# Patient Record
Sex: Female | Born: 1995 | Hispanic: Yes | Marital: Single | Smoking: Former smoker
Health system: Southern US, Community
[De-identification: ages and names within clinical notes are randomized; demographics above are authoritative.]

## PROBLEM LIST (undated history)

## (undated) DIAGNOSIS — F909 Attention-deficit hyperactivity disorder, unspecified type: Secondary | ICD-10-CM

## (undated) HISTORY — PX: DENTAL SURGERY: SHX609

---

## 2014-12-03 ENCOUNTER — Ambulatory Visit (INDEPENDENT_AMBULATORY_CARE_PROVIDER_SITE_OTHER): Payer: Self-pay | Admitting: Internal Medicine

## 2014-12-03 VITALS — BP 100/60 | HR 66 | Temp 97.9°F | Resp 18 | Ht 61.0 in | Wt 120.0 lb

## 2014-12-03 DIAGNOSIS — N9089 Other specified noninflammatory disorders of vulva and perineum: Secondary | ICD-10-CM

## 2014-12-03 DIAGNOSIS — B356 Tinea cruris: Secondary | ICD-10-CM

## 2014-12-03 LAB — POCT WET PREP WITH KOH
KOH Prep POC: NEGATIVE
Trichomonas, UA: NEGATIVE
Yeast Wet Prep HPF POC: NEGATIVE

## 2014-12-03 MED ORDER — TERBINAFINE HCL 250 MG PO TABS: 250.0000 mg | ORAL_TABLET | Freq: Every day | ORAL | Status: DC

## 2014-12-03 NOTE — Progress Notes (Signed)
   Subjective:  This chart was scribed for [name], MD by [name], ED Scribe. The patient was seen in room 8. Patient's care was started at 9:22 AM.   Patient ID: Jacqueline [name], female    DOB: [birth year], 19 y.o.   MRN: [id number]  Chief Complaint  Patient presents with  . Rash    x2 mths near vaginal area speading to gluteal area   HPI HPI Comments: Jacqueline [name] is a 19 y.o. female, with no pertinent medical history, who presents to the [facility] complaining of gradually spreading itchy rash near her vaginal area to her gluteal area for the past 2 months. She states that rash began as a few bumps before becoming red and itchy. She also reports that her current rash is flaky with intermittent white discharge. Pt had a similar rash a few years ago that was less severe. Pt reports working out often, but has noticed that the rash spreads even when she stopped working out. She has also noticed that wearing pads during her menstrual cycle seems to aggravate the rash. Pt denies dysuria and dyspareunia, although she reports that she has not had sexual intercourse since onset of rash more than one year ago. She has been treating with Lotrimin cream with minimal relief.  No history of diabetes Works long hours in [employer] with lots of sweat History reviewed. No pertinent past medical history. No current outpatient prescriptions on file prior to visit.   No current facility-administered medications on file prior to visit.   No Known Allergies  Review of Systems  Genitourinary: Negative for dysuria and dyspareunia.  Skin: Positive for rash.   BP 100/60 mmHg  Pulse 66  Temp(Src) 97.9 F (36.6 C) (Oral)  Resp 18  Ht 5\' 1"  (1.549 m)  Wt 120 lb (54.432 kg)  BMI 22.69 kg/m2  SpO2 99%  LMP 12/01/2014    Objective:   Physical Exam  Constitutional: She is oriented to person, place, and time. She appears well-developed and well-nourished. No distress.   HENT:  Head: Normocephalic and atraumatic.  Eyes: Conjunctivae and EOM are normal.  Neck: Neck supple. No tracheal deviation present.  Cardiovascular: Normal rate.   Pulmonary/Chest: Effort normal. No respiratory distress.  Genitourinary:  Erythematous rash in the groins approaching the labia and the L buttock.   Musculoskeletal: Normal range of motion.  Neurological: She is alert and oriented to person, place, and time.  Skin: Skin is warm and dry. Rash noted.  Psychiatric: She has a normal mood and affect. Her behavior is normal.  Nursing note and vitals reviewed.    wet prep KOH shows no yeast Assessment & Plan:   I have completed the patient encounter in its entirety as documented by the scribe, with editing by me where necessary. [name], M.D.  Tinea cruris resistant to topicals Meds ordered this encounter  Medications  . terbinafine (LAMISIL) 250 MG tablet    Sig: Take 1 tablet (250 mg total) by mouth daily.    Dispense:  30 tablet    Refill:  0

## 2016-11-25 ENCOUNTER — Ambulatory Visit (INDEPENDENT_AMBULATORY_CARE_PROVIDER_SITE_OTHER): Payer: Self-pay | Admitting: Physician Assistant

## 2016-11-25 VITALS — BP 98/62 | HR 71 | Temp 98.9°F | Resp 16 | Ht 61.0 in | Wt 118.8 lb

## 2016-11-25 DIAGNOSIS — R8299 Other abnormal findings in urine: Secondary | ICD-10-CM

## 2016-11-25 DIAGNOSIS — R82998 Other abnormal findings in urine: Secondary | ICD-10-CM

## 2016-11-25 DIAGNOSIS — B3731 Acute candidiasis of vulva and vagina: Secondary | ICD-10-CM

## 2016-11-25 DIAGNOSIS — N898 Other specified noninflammatory disorders of vagina: Secondary | ICD-10-CM

## 2016-11-25 DIAGNOSIS — B373 Candidiasis of vulva and vagina: Secondary | ICD-10-CM

## 2016-11-25 LAB — POCT WET + KOH PREP: Trich by wet prep: ABSENT

## 2016-11-25 LAB — POC MICROSCOPIC URINALYSIS (UMFC): Mucus: ABSENT

## 2016-11-25 LAB — POCT URINE PREGNANCY: Preg Test, Ur: NEGATIVE

## 2016-11-25 LAB — POCT URINALYSIS DIP (MANUAL ENTRY)
Bilirubin, UA: NEGATIVE
Blood, UA: NEGATIVE
Glucose, UA: NEGATIVE mg/dL
Ketones, POC UA: NEGATIVE mg/dL
Nitrite, UA: NEGATIVE
Protein Ur, POC: NEGATIVE mg/dL
Spec Grav, UA: 1.025 (ref 1.010–1.025)
Urobilinogen, UA: 0.2 E.U./dL
pH, UA: 7 (ref 5.0–8.0)

## 2016-11-25 MED ORDER — FLUCONAZOLE 150 MG PO TABS: 150.0000 mg | ORAL_TABLET | Freq: Once | ORAL | 0 refills | Status: AC

## 2016-11-25 NOTE — Progress Notes (Signed)
sj

## 2016-11-25 NOTE — Progress Notes (Signed)
11/25/2016 at 1:09 PM  Jacqueline [name] / DOB: 1996/01/29 / MRN: [id number]  The patient  does not have a problem list on file.  SUBJECTIVE  Jacqueline [name] is a 21 y.o. female who complains of genital irritation and vaginal dryness x 4 days. Jacqueline [name] denies hematuria, urinary frequency, urinary urgency, flank pain, abdominal pain, genital rash and vaginal discharge. Jacqueline [name] uses vagisil wash daily for the past few months. Has not tried anything for relief. Jacqueline [name] is sexually active with monogamous partner. Last STD testing was a few months ago, all was normal. Jacqueline [name] has not changed partners since. LMP 10/29/16.   Jacqueline [name]  has no past medical history on file.    Medications reviewed and updated by myself where necessary, and exist elsewhere in the encounter.   Jacqueline [name] has No Known Allergies. Jacqueline [name]  reports that Jacqueline [name] has never smoked. Jacqueline [name] has never used smokeless tobacco. Jacqueline [name] reports that Jacqueline [name] does not drink alcohol or use drugs. Jacqueline [name]  has no sexual activity history on file. The patient  has no past surgical history on file.  Jacqueline [name] family history is not on file.  ROS  OBJECTIVE  Jacqueline [name]  height is 5\' 1"  (1.549 m) and weight is 118 lb 12.8 oz (53.9 kg). Jacqueline [name] oral temperature is 98.9 F (37.2 C). Jacqueline [name] blood pressure is 98/62 and Jacqueline [name] pulse is 71. Jacqueline [name] respiration is 16 and oxygen saturation is 100%.  The patient's body mass index is 22.45 kg/m.  Physical Exam  Constitutional: Jacqueline [name] is oriented to person, place, and time. Jacqueline [name] appears well-developed and well-nourished. No distress.  HENT:  Head: Normocephalic and atraumatic.  Eyes: Conjunctivae are normal.  Respiratory: Effort normal.  Genitourinary: There is erythema (of vulva) and tenderness (with speculum exam ) in the vagina. No bleeding in the vagina. Vaginal discharge (white, curdy discharge in vaginal canal) found.  Musculoskeletal: Normal range of motion.  Neurological: Jacqueline [name] is alert and oriented to person, place, and time.  Skin: Skin is warm and dry.    Psychiatric: Jacqueline [name] has a normal mood and affect.    Results for orders placed or performed in visit on 11/25/16 (from the past 24 hour(s))  POCT Wet + KOH Prep     Status: Abnormal   Collection Time: 11/25/16 12:54 PM  Result Value Ref Range   Yeast by KOH Present (A) Absent   Yeast by wet prep Present (A) Absent   WBC by wet prep Moderate (A) Few   Clue Cells Wet Prep HPF POC Few (A) None   Trich by wet prep Absent Absent   Bacteria Wet Prep HPF POC Many (A) Few   Epithelial Cells By [employer] Pref ([facility]) Moderate (A) None, Few, Too numerous to count   RBC,UR,HPF,POC None None RBC/hpf  POCT urinalysis dipstick     Status: Abnormal   Collection Time: 11/25/16 12:55 PM  Result Value Ref Range   Color, UA yellow yellow   Clarity, UA cloudy (A) clear   Glucose, UA negative negative mg/dL   Bilirubin, UA negative negative   Ketones, POC UA negative negative mg/dL   Spec Grav, UA 1.025 1.010 - 1.025   Blood, UA negative negative   pH, UA 7.0 5.0 - 8.0   Protein Ur, POC negative negative mg/dL   Urobilinogen, UA 0.2 0.2 or 1.0 E.U./dL   Nitrite, UA Negative Negative   Leukocytes, UA small (1+) small (1+)  POCT urine pregnancy     Status: None   Collection Time: 11/25/16 12:56 PM  Result Value Ref Range   Preg Test, Ur Negative Negative  POCT Microscopic Urinalysis ([facility])     Status: Abnormal   Collection Time: 11/25/16 12:58 PM  Result Value Ref Range   WBC,UR,HPF,POC Moderate (A) None WBC/hpf   RBC,UR,HPF,POC None None RBC/hpf   Bacteria Many (A) None, Too numerous to count   Mucus Absent Absent   Epithelial Cells, UR Per Microscopy Moderate (A) None, Too numerous to count cells/hpf    ASSESSMENT & PLAN  Jacqueline [name] was seen today for vaginal pain.  Diagnoses and all orders for this visit:  Vaginal dryness -     POCT urinalysis dipstick -     POCT Microscopic Urinalysis ([facility]) -     POCT Wet + KOH Prep -     POCT urine pregnancy  Leukocytes in urine - Jacqueline [name] is not experiencing  any urinary symptoms at this time, will hold off on Rx for antibiotic. Urine culture pending. Jacqueline [name] instructed to contact our office if Jacqueline [name] develops any urinary symptoms or lower abdominal pain.  -     Urine culture  Vaginal yeast infection -     fluconazole (DIFLUCAN) 150 MG tablet; Take 1 tablet (150 mg total) by mouth once.    The patient was advised to call or come back to clinic if Jacqueline [name] does not see an improvement in symptoms, or worsens with the above plan.   Tenna Delaine, PA-C [facility] 11/25/2016 1:09 PM

## 2016-11-25 NOTE — Patient Instructions (Addendum)
You should stop using the vagisil wash daily as this can actually lead to worsening symptoms and recurrent infections. Plan soap and water is sufficient for cleansing the vagina. It does appear from your lab results that you are having a yeast infection. I have prescribed an oral tablet for this condition. Please let me know if your symptoms persist after 48 hours of taking the tablet.It also appears that you may be having a UTI, I have sent off a urine culture and will contact you with the results if you need an antibiotic. In the meantime, if you develop burning with urination, urinary frequency, or lower abdominal discomfort, please call our office.   Thank you for letting me participate in your health and well being.       Vaginal Yeast infection, Adult Vaginal yeast infection is a condition that causes soreness, swelling, and redness (inflammation) of the vagina. It also causes vaginal discharge. This is a common condition. Some women get this infection frequently. What are the causes? This condition is caused by a change in the normal balance of the yeast (candida) and bacteria that live in the vagina. This change causes an overgrowth of yeast, which causes the inflammation. What increases the risk? This condition is more likely to develop in:  Women who take antibiotic medicines.  Women who have diabetes.  Women who take birth control pills.  Women who are pregnant.  Women who douche often.  Women who have a weak defense (immune) system.  Women who have been taking steroid medicines for a long time.  Women who frequently wear tight clothing. What are the signs or symptoms? Symptoms of this condition include:  White, thick vaginal discharge.  Swelling, itching, redness, and irritation of the vagina. The lips of the vagina (vulva) may be affected as well.  Pain or a burning feeling while urinating.  Pain during sex. How is this diagnosed? This condition is diagnosed with  a medical history and physical exam. This will include a pelvic exam. Your health care provider will examine a sample of your vaginal discharge under a microscope. Your health care provider may send this sample for testing to confirm the diagnosis. How is this treated? This condition is treated with medicine. Medicines may be over-the-counter or prescription. You may be told to use one or more of the following:  Medicine that is taken orally.  Medicine that is applied as a cream.  Medicine that is inserted directly into the vagina (suppository). Follow these instructions at home:  Take or apply over-the-counter and prescription medicines only as told by your health care provider.  Do not have sex until your health care provider has approved. Tell your sex partner that you have a yeast infection. That person should go to his or her health care provider if he or she develops symptoms.  Do not wear tight clothes, such as pantyhose or tight pants.  Avoid using tampons until your health care provider approves.  Eat more yogurt. This may help to keep your yeast infection from returning.  Try taking a sitz bath to help with discomfort. This is a warm water bath that is taken while you are sitting down. The water should only come up to your hips and should cover your buttocks. Do this 3-4 times per day or as told by your health care provider.  Do not douche.  Wear breathable, cotton underwear.  If you have diabetes, keep your blood sugar levels under control. Contact a health care provider  if:  You have a fever.  Your symptoms go away and then return.  Your symptoms do not get better with treatment.  Your symptoms get worse.  You have new symptoms.  You develop blisters in or around your vagina.  You have blood coming from your vagina and it is not your menstrual period.  You develop pain in your abdomen. This information is not intended to replace advice given to you by your  health care provider. Make sure you discuss any questions you have with your health care provider. Document Released: 05/13/2005 Document Revised: 01/15/2016 Document Reviewed: 02/04/2015 Elsevier Interactive Patient Education  2017 [employer].     IF you received an x-ray today, you will receive an invoice from [facility]. Please contact [facility] at [phone] with questions or concerns regarding your invoice.   IF you received labwork today, you will receive an invoice from [place]. Please contact [facility] at [phone] with questions or concerns regarding your invoice.   Our billing staff will not be able to assist you with questions regarding bills from these companies.  You will be contacted with the lab results as soon as they are available. The fastest way to get your results is to activate your My Chart account. Instructions are located on the last page of this paperwork. If you have not heard from [place] regarding the results in 2 weeks, please contact this office.

## 2016-11-26 ENCOUNTER — Telehealth: Payer: Self-pay | Admitting: Family Medicine

## 2016-11-26 LAB — URINE CULTURE

## 2016-11-26 NOTE — Telephone Encounter (Signed)
[place] patient is calling back saying that she is having dysuria and irritation feels like its inflamed please respond

## 2016-11-26 NOTE — Telephone Encounter (Signed)
Please see urine results

## 2016-11-28 ENCOUNTER — Encounter (HOSPITAL_COMMUNITY): Payer: Self-pay | Admitting: *Deleted

## 2016-11-28 ENCOUNTER — Emergency Department (HOSPITAL_COMMUNITY)
Admission: EM | Admit: 2016-11-28 | Discharge: 2016-11-28 | Disposition: A | Discharge status: Home / Self Care | Payer: Self-pay | Attending: Emergency Medicine | Admitting: Emergency Medicine

## 2016-11-28 DIAGNOSIS — B3731 Acute candidiasis of vulva and vagina: Secondary | ICD-10-CM

## 2016-11-28 DIAGNOSIS — N72 Inflammatory disease of cervix uteri: Secondary | ICD-10-CM | POA: Insufficient documentation

## 2016-11-28 DIAGNOSIS — B373 Candidiasis of vulva and vagina: Secondary | ICD-10-CM | POA: Insufficient documentation

## 2016-11-28 LAB — POC URINE PREG, ED: Preg Test, Ur: NEGATIVE

## 2016-11-28 MED ORDER — CEFTRIAXONE SODIUM 250 MG IJ SOLR
250.0000 mg | Freq: Once | INTRAMUSCULAR | Status: AC
  Administered 2016-11-28: 250 mg via INTRAMUSCULAR
  Filled 2016-11-28: qty 250

## 2016-11-28 MED ORDER — MICONAZOLE NITRATE 200 & 2 MG-% (9GM) VA KIT: 1.0000 | PACK | Freq: Every evening | VAGINAL | 0 refills | Status: DC

## 2016-11-28 MED ORDER — STERILE WATER FOR INJECTION IJ SOLN
INTRAMUSCULAR | Status: AC
  Administered 2016-11-28: 10 mL via INTRAMUSCULAR
  Filled 2016-11-28: qty 10

## 2016-11-28 MED ORDER — STERILE WATER FOR INJECTION IJ SOLN
10.0000 mL | Freq: Once | INTRAMUSCULAR | Status: AC
  Administered 2016-11-28: 10 mL via INTRAMUSCULAR

## 2016-11-28 MED ORDER — DOXYCYCLINE HYCLATE 100 MG PO CAPS: 100.0000 mg | ORAL_CAPSULE | Freq: Two times a day (BID) | ORAL | 0 refills | Status: DC

## 2016-11-28 NOTE — ED Notes (Addendum)
Patient feels she has a yeast infection which began Wednesday. 6/10 pain in vaginal area.  Reports she was seen Wednesday at [facility]. Prescribed Diflucan x 1. States she felt worse after taking medication. No improvement in symptoms.

## 2016-11-28 NOTE — ED Triage Notes (Signed)
Pt c/o vaginal burning and itching since last Tuesday, worsening last night with vag discharge. Denies abd pain

## 2016-11-28 NOTE — ED Provider Notes (Signed)
[place] DEPT Provider Note   CSN: [id number] Arrival date & time: 11/28/16  3888     History   Chief Complaint Chief Complaint  Patient presents with  . Vaginal Itching    HPI Jacqueline [name] is a 21 y.o. female.  21yo F who p/w vaginal itching and pain. Pt has had 6 days of vaginal itching and burning. She was seen in the clinic a few days ago and diagnosed w/ yeast infection, given diflucan which she took. She reports that her symptoms have been worsening since she took the medication and she has seen no improvement whatsoever. She reports some mild suprapubic abdominal pain and worsening burning and pain in her vagina. She has not been sexually active since the symptoms began. She reports pain of the skin when urinating but no urethral burning. No fevers, vomiting, or other new symptoms. She is sexually active with one partner. She does use an over-the-counter feminine soap and wipes.   The history is provided by the patient.    History reviewed. No pertinent past medical history.  There are no active problems to display for this patient.   History reviewed. No pertinent surgical history.  OB History    No data available       Home Medications    Prior to Admission medications   Medication Sig Start Date End Date Taking? Authorizing Provider  doxycycline (VIBRAMYCIN) 100 MG capsule Take 1 capsule (100 mg total) by mouth 2 (two) times daily. 11/28/16   Sharlett Iles, MD  miconazole nitrate (MICONAZOLE 3 COMBO PACK) 200 & 2 MG-% (9GM) KIT Place 1 applicator vaginally Nightly. Insert applicator nightly for 7 days as directed on box. Apply cream on external vagina/vulva once daily for 7 days or until symptoms resolve 11/28/16   Sharlett Iles, MD    Family History No family history on file.  Social History Social History  Substance Use Topics  . Smoking status: Never Smoker  . Smokeless tobacco: Never Used  . Alcohol use 0.0 oz/week      Allergies   Patient has no known allergies.   Review of Systems Review of Systems  All other systems reviewed and are negative.    Physical Exam Updated Vital Signs BP 98/62   Pulse 67   Temp 99.4 F (37.4 C) (Oral)   Resp 20   LMP 10/29/2016   SpO2 99%   Physical Exam  Constitutional: She is oriented to person, place, and time. She appears well-developed and well-nourished. No distress.  HENT:  Head: Normocephalic and atraumatic.  Moist mucous membranes  Eyes: Conjunctivae are normal. Pupils are equal, round, and reactive to light.  Neck: Neck supple.  Cardiovascular: Normal rate, regular rhythm and normal heart sounds.   No murmur heard. Pulmonary/Chest: Effort normal and breath sounds normal.  Abdominal: Soft. Bowel sounds are normal. She exhibits no distension and no mass. There is tenderness (mild suprapubic). There is no rebound and no guarding.  Genitourinary:  Genitourinary Comments: Mild erythema of vulva; erythema and tenderness of vaginal with thick white curd-like vaginal discharge; suprapubic tenderness on bimanual exam, no adnexal tenderness  Musculoskeletal: She exhibits no edema.  Neurological: She is alert and oriented to person, place, and time.  Fluent speech  Skin: Skin is warm and dry.  Psychiatric: She has a normal mood and affect. Judgment normal.  Nursing note and vitals reviewed.  Chaperone was present during exam.    ED Treatments / Results  Labs (all labs ordered are  listed, but only abnormal results are displayed) Labs Reviewed  POC URINE PREG, ED  GC/CHLAMYDIA PROBE AMP ([place]) NOT AT [facility]    EKG  EKG Interpretation None       Radiology No results found.  Procedures Procedures (including critical care time)  Medications Ordered in ED Medications  cefTRIAXone (ROCEPHIN) injection 250 mg (250 mg Intramuscular Given 11/28/16 0835)  sterile water (preservative free) injection 10 mL (10 mLs Injection Given  11/28/16 0835)     Initial Impression / Assessment and Plan / ED Course  I have reviewed the triage vital signs and the nursing notes.  Pertinent labs  that were available during my care of the patient were reviewed by me and considered in my medical decision making (see chart for details).    PT w/ ongoing Vaginal burning and itching that did not improve after Diflucan treatment a few days ago. She was well-appearing on exam with normal vital signs. She had mild suprapubic abdominal tenderness with no rebound or guarding. Pelvic exam showed findings consistent with a yeast infection but I am concerned about the presence of suprapubic tenderness and therefore will treat with ceftriaxone and doxycycline to cover PID. Also recommended 7 day Monistat treatment with both applicator and topical treatment of yeast. I reviewed her clinic note from a few days ago which shows negative UA and wet prep notable only for yeast therefore did not repeat wet prep today as her symptoms appear exactly consistent with clinic note. Extensively reviewed return precautions and provided with women's clinic follow-up information. Patient voiced understanding and was discharged in satisfactory condition.  Final Clinical Impressions(s) / ED Diagnoses   Final diagnoses:  Yeast vaginitis  Cervicitis    New Prescriptions Discharge Medication List as of 11/28/2016  8:31 AM    START taking these medications   Details  doxycycline (VIBRAMYCIN) 100 MG capsule Take 1 capsule (100 mg total) by mouth 2 (two) times daily., Starting Sat 11/28/2016, Print    miconazole nitrate (MICONAZOLE 3 COMBO PACK) 200 & 2 MG-% (9GM) KIT Place 1 applicator vaginally Nightly. Insert applicator nightly for 7 days as directed on box. Apply cream on external vagina/vulva once daily for 7 days or until symptoms resolve, Starting Sat 11/28/2016, Print         Sharlett Iles, MD 11/28/16 [phone]

## 2016-11-28 NOTE — Discharge Instructions (Signed)
FOLLOW INSTRUCTIONS ON MONISTAT 7-DAY KIT TO APPLY MEDICATION FOR YEAST INFECTION. TAKE ANTIBIOTICS AS PRESCRIBED UNTIL THEY ARE FINISHED. STOP USING ANY SOAPS OR WIPES ON VAGINAL AREA AND USE WATER ONLY TO [place]. RETURN IMMEDIATELY IF YOU HAVE ANY WORSENING SYMPTOMS, FEVER, VOMITING, OR SEVERE ABDOMINAL PAIN.

## 2016-11-30 ENCOUNTER — Ambulatory Visit: Payer: Self-pay

## 2016-11-30 LAB — GC/CHLAMYDIA PROBE AMP (~~LOC~~) NOT AT ARMC
Chlamydia: NEGATIVE
Neisseria Gonorrhea: NEGATIVE

## 2016-11-30 NOTE — Telephone Encounter (Signed)
Pt contacted. She apparently was having worsening vaginal itching and went to the ED. They treated her for possible PID and added a topical miconazole.

## 2017-07-23 ENCOUNTER — Other Ambulatory Visit (HOSPITAL_COMMUNITY): Payer: Self-pay | Admitting: *Deleted

## 2017-07-23 DIAGNOSIS — N6310 Unspecified lump in the right breast, unspecified quadrant: Secondary | ICD-10-CM

## 2017-07-29 ENCOUNTER — Other Ambulatory Visit (HOSPITAL_COMMUNITY): Payer: Self-pay | Admitting: Obstetrics and Gynecology

## 2017-07-29 ENCOUNTER — Ambulatory Visit
Admission: RE | Admit: 2017-07-29 | Discharge: 2017-07-29 | Disposition: A | Discharge status: Home / Self Care | Payer: Self-pay | Source: Ambulatory Visit | Attending: Obstetrics and Gynecology | Admitting: Obstetrics and Gynecology

## 2017-07-29 ENCOUNTER — Ambulatory Visit (HOSPITAL_COMMUNITY)
Admission: RE | Admit: 2017-07-29 | Discharge: 2017-07-29 | Disposition: A | Discharge status: Home / Self Care | Payer: Self-pay | Source: Ambulatory Visit | Attending: Obstetrics and Gynecology | Admitting: Obstetrics and Gynecology

## 2017-07-29 ENCOUNTER — Encounter (HOSPITAL_COMMUNITY): Payer: Self-pay

## 2017-07-29 VITALS — BP 120/70 | Temp 97.6°F | Ht 61.0 in | Wt 113.2 lb

## 2017-07-29 DIAGNOSIS — Z01419 Encounter for gynecological examination (general) (routine) without abnormal findings: Secondary | ICD-10-CM

## 2017-07-29 DIAGNOSIS — N6310 Unspecified lump in the right breast, unspecified quadrant: Secondary | ICD-10-CM

## 2017-07-29 NOTE — Addendum Note (Signed)
Encounter addended by: Stephanie Coup, LPN on: 16/55/3748 27:07 PM  Actions taken: Order list changed

## 2017-07-29 NOTE — Progress Notes (Signed)
Complaints of mobile right breast lump x 2 months that is painful at times. Patient states the pain has decreased over the past week. Patient stated she initially rated it a 7 out of 10 but now rates it a 4 out of 10.  Pap Smear: Pap smear completed today. Per patient has never had a Pap smear. No Pap smear results are in Epic.  Physical exam: Breasts Breasts symmetrical. No skin abnormalities bilateral breasts. No nipple retraction bilateral breasts. No nipple discharge bilateral breasts. No lymphadenopathy. No lumps palpated left breast. Palpated a mobile lump within the right breast between 1-2 o'clock next to areola. No complaints of pain or tenderness on exam. Referred patient to the [facility] for a right breast ultrasound. Appointment scheduled for Thursday, July 29, 2017 at 1100.  Pelvic/Bimanual   Ext Genitalia No lesions, no swelling and no discharge observed on external genitalia.         Vagina Vagina pink and normal texture. No lesions or discharge observed in vagina.          Cervix Cervix is present. Cervix pink and of normal texture. Cervix friable. No discharge observed.     Uterus Uterus is present and palpable. Uterus in normal position and normal size.        Adnexae Bilateral ovaries present and palpable. No tenderness on palpation.          Rectovaginal No rectal exam completed today since patient had no rectal complaints. No skin abnormalities observed on exam.    Smoking History: Patient has never smoked.  Patient Navigation: Patient education provided. Access to services provided for patient through [facility] program.

## 2017-07-29 NOTE — Patient Instructions (Signed)
Explained breast self awareness with Jacqueline [name]. Let patient know [facility] will cover Pap smears every 3 years unless has a history of abnormal Pap smears. Referred patient to the [facility] for a right breast ultrasound. Appointment scheduled for Thursday, July 29, 2017 at 1100. Let patient know will follow up with her within the next couple weeks with results of Pap smear by letter or phone. Jacqueline [name] verbalized understanding.  Jacqueline [name], [name], RN 10:00 AM

## 2017-07-30 ENCOUNTER — Encounter (HOSPITAL_COMMUNITY): Payer: Self-pay

## 2017-08-03 LAB — CYTOLOGY - PAP
Bacterial vaginitis: POSITIVE — AB
Candida vaginitis: NEGATIVE
Diagnosis: UNDETERMINED — AB
HPV: NOT DETECTED
Trichomonas: NEGATIVE

## 2017-08-13 ENCOUNTER — Other Ambulatory Visit (HOSPITAL_COMMUNITY): Payer: Self-pay | Admitting: *Deleted

## 2017-08-13 ENCOUNTER — Telehealth (HOSPITAL_COMMUNITY): Payer: Self-pay | Admitting: *Deleted

## 2017-08-13 MED ORDER — METRONIDAZOLE 500 MG PO TABS: 500.0000 mg | ORAL_TABLET | Freq: Two times a day (BID) | ORAL | 0 refills | Status: DC

## 2017-08-13 NOTE — Telephone Encounter (Signed)
Telephoned patient at home number and advised patient of abnormal pap smear results. HPV was negative. Next pap smear due in one year. Advised patient pap smear did show bacterial vaginosis and medication was called into Product/process development scientist at [facility]. Advised patient to finish all medication and no alcohol while taking. Patient voiced understanding. Used interpreter Benjamine Sprague.

## 2017-12-02 ENCOUNTER — Ambulatory Visit: Payer: Self-pay | Admitting: Physician Assistant

## 2017-12-02 ENCOUNTER — Other Ambulatory Visit: Payer: Self-pay

## 2017-12-02 ENCOUNTER — Encounter: Payer: Self-pay | Admitting: Physician Assistant

## 2017-12-02 VITALS — BP 100/62 | HR 88 | Temp 98.6°F | Resp 17 | Ht 61.0 in | Wt 121.8 lb

## 2017-12-02 DIAGNOSIS — H60502 Unspecified acute noninfective otitis externa, left ear: Secondary | ICD-10-CM

## 2017-12-02 DIAGNOSIS — R61 Generalized hyperhidrosis: Secondary | ICD-10-CM

## 2017-12-02 MED ORDER — HYDROCORTISONE-ACETIC ACID 1-2 % OT SOLN: 5.0000 [drp] | OTIC | 0 refills | Status: DC

## 2017-12-02 NOTE — Progress Notes (Signed)
     MRN: [id number] DOB: 1996-05-09  Subjective:   Jacqueline [name] is a 22 y.o. female presenting for chief complaint of Ear Pain (x yesterday, left ear pain, pain level 7/10.  Per pt used 1/2 water and 1/2 vinegar solution in ear for a minute but no relief) .  Reports 2 day history of left ear pain. Has some itching, drainage, and decreased hearing. Does have some nasal congestion. Denies fever, tinnitus, sinus pain, itchy watery eyes and sore throat, nausea, vomiting, abdominal pain and diarrhea. Has tried vinegar and water with no relief. No recent swimming. Denies use of qtips. Has history of seasonal allergies, no history of asthma.  Excessive sweating x  5th grade. Happens on bilateral palms, arms, face, upper lip, forehead, and feet. Happens every day. Can happen at any time: sitting, walking, standing.  No other symptoms when this happens. Has tried antiperspirants and many home remedies no full resolution. Has had thyroid studies recently and they were normal. Avoids working out due to this. FH of hyperhidrosis in siblings.    Jacqueline [name] currently has no medications in their medication list. Also has No Known Allergies.  Jacqueline [name]  has no past medical history on file. Also  has no past surgical history on file.   Objective:   Vitals: BP 100/62 (BP Location: Left Arm, Patient Position: Sitting, Cuff Size: Normal)   Pulse 88   Temp 98.6 F (37 C) (Oral)   Resp 17   Ht 5\' 1"  (1.549 m)   Wt 121 lb 12.8 oz (55.2 kg)   LMP 11/12/2017   SpO2 96%   BMI 23.01 kg/m   Physical Exam  Constitutional: She is oriented to person, place, and time. She appears well-developed and well-nourished. No distress.  HENT:  Head: Normocephalic and atraumatic.  Right Ear: Tympanic membrane, external ear and ear canal normal.  Left Ear: Tympanic membrane normal. There is swelling (mild swelling of ear canal) and tenderness (with palpation of tragus). No drainage. Tympanic membrane is not perforated, not  erythematous and not bulging.  Nose: Mucosal edema (bilateral) present.  Mouth/Throat: Uvula is midline, oropharynx is clear and moist and mucous membranes are normal.  Eyes: Conjunctivae are normal.  Neck: Normal range of motion.  Pulmonary/Chest: Effort normal.  Neurological: She is alert and oriented to person, place, and time.  Skin: Skin is warm.  Bilateral palmar hyperhidrosis noted.   Psychiatric: She has a normal mood and affect.  Vitals reviewed.   No results found for this or any previous visit (from the past 24 hour(s)).  Assessment and Plan :  1. Acute otitis externa of left ear, unspecified type Mild in nature. Recommend tx with vosol-hc. Follow up as needed. - acetic acid-hydrocortisone (VOSOL-HC) OTIC solution; Place 5 drops into the left ear every 4 (four) hours.  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

## 2017-12-02 NOTE — Patient Instructions (Signed)
Your ear exam is consistent with otitis externa.  I have sent in a prescription for eardrops to use.  I also recommend continuing with allergy medicine daily for the next 2 weeks.  In terms of sweating, place referral to dermatology. You should hear from them within 1-2 weeks to schedule appointment.   Otitis Externa Otitis externa is an infection of the outer ear canal. The outer ear canal is the area between the outside of the ear and the eardrum. Otitis externa is sometimes called "swimmer's ear." Follow these instructions at home:  If you were given antibiotic ear drops, use them as told by your doctor. Do not stop using them even if your condition gets better.  Take over-the-counter and prescription medicines only as told by your doctor.  Keep all follow-up visits as told by your doctor. This is important. How is this prevented?  Keep your ear dry. Use the corner of a towel to dry your ear after you swim or bathe.  Try not to scratch or put things in your ear. Doing these things makes it easier for germs to grow in your ear.  Avoid swimming in lakes, dirty water, or pools that may not have the right amount of a chemical called chlorine.  Consider making ear drops and putting 3 or 4 drops in each ear after you swim. Ask your doctor about how you can make ear drops. Contact a doctor if:  You have a fever.  After 3 days your ear is still red, swollen, or painful.  After 3 days you still have pus coming from your ear.  Your redness, swelling, or pain gets worse.  You have a really bad headache.  You have redness, swelling, pain, or tenderness behind your ear. This information is not intended to replace advice given to you by your health care provider. Make sure you discuss any questions you have with your health care provider. Document Released: 01/20/2008 Document Revised: 08/29/2015 Document Reviewed: 05/13/2015 Elsevier Interactive Patient Education  [employer].

## 2018-01-27 ENCOUNTER — Other Ambulatory Visit: Payer: Self-pay

## 2018-01-28 ENCOUNTER — Other Ambulatory Visit (HOSPITAL_COMMUNITY): Payer: Self-pay | Admitting: Obstetrics and Gynecology

## 2018-01-28 ENCOUNTER — Ambulatory Visit
Admission: RE | Admit: 2018-01-28 | Discharge: 2018-01-28 | Disposition: A | Discharge status: Home / Self Care | Payer: No Typology Code available for payment source | Source: Ambulatory Visit | Attending: Obstetrics and Gynecology | Admitting: Obstetrics and Gynecology

## 2018-01-28 DIAGNOSIS — N6310 Unspecified lump in the right breast, unspecified quadrant: Secondary | ICD-10-CM

## 2018-02-02 ENCOUNTER — Other Ambulatory Visit (HOSPITAL_COMMUNITY): Payer: Self-pay | Admitting: Obstetrics and Gynecology

## 2018-02-02 ENCOUNTER — Ambulatory Visit
Admission: RE | Admit: 2018-02-02 | Discharge: 2018-02-02 | Disposition: A | Discharge status: Home / Self Care | Payer: No Typology Code available for payment source | Source: Ambulatory Visit | Attending: Obstetrics and Gynecology | Admitting: Obstetrics and Gynecology

## 2018-02-02 DIAGNOSIS — N6310 Unspecified lump in the right breast, unspecified quadrant: Secondary | ICD-10-CM

## 2018-08-24 ENCOUNTER — Other Ambulatory Visit (HOSPITAL_COMMUNITY): Payer: Self-pay | Admitting: *Deleted

## 2018-08-24 DIAGNOSIS — N6310 Unspecified lump in the right breast, unspecified quadrant: Secondary | ICD-10-CM

## 2018-09-01 ENCOUNTER — Encounter (HOSPITAL_COMMUNITY): Payer: Self-pay

## 2018-09-01 ENCOUNTER — Ambulatory Visit (HOSPITAL_COMMUNITY)
Admission: RE | Admit: 2018-09-01 | Discharge: 2018-09-01 | Disposition: A | Discharge status: Home / Self Care | Payer: Self-pay | Source: Ambulatory Visit | Attending: Obstetrics and Gynecology | Admitting: Obstetrics and Gynecology

## 2018-09-01 ENCOUNTER — Encounter (HOSPITAL_COMMUNITY): Payer: Self-pay | Admitting: *Deleted

## 2018-09-01 ENCOUNTER — Ambulatory Visit
Admission: RE | Admit: 2018-09-01 | Discharge: 2018-09-01 | Disposition: A | Discharge status: Home / Self Care | Payer: No Typology Code available for payment source | Source: Ambulatory Visit | Attending: Obstetrics and Gynecology | Admitting: Obstetrics and Gynecology

## 2018-09-01 VITALS — BP 102/64 | Ht 61.0 in | Wt 116.0 lb

## 2018-09-01 DIAGNOSIS — Z01419 Encounter for gynecological examination (general) (routine) without abnormal findings: Secondary | ICD-10-CM

## 2018-09-01 DIAGNOSIS — N6452 Nipple discharge: Secondary | ICD-10-CM | POA: Insufficient documentation

## 2018-09-01 DIAGNOSIS — N6310 Unspecified lump in the right breast, unspecified quadrant: Secondary | ICD-10-CM

## 2018-09-01 DIAGNOSIS — N6312 Unspecified lump in the right breast, upper inner quadrant: Secondary | ICD-10-CM

## 2018-09-01 HISTORY — DX: Attention-deficit hyperactivity disorder, unspecified type: F90.9

## 2018-09-01 NOTE — Patient Instructions (Addendum)
Explained breast self awareness with Jacqueline [name]. Let patient know that her next Pap smear will be due based on the result of today's Pap smear due to her last Pap smear was abnormal. Referred patient to the [facility] for a right breast ultrasound. Appointment scheduled for Thursday, September 01, 2018 at 0900. Patient aware of appointment and will be there. Let patient know will follow up with her within the next couple weeks with results of Pap smear and breast discharge by phone. Jacqueline [name] verbalized understanding.  [name], Jacqueline Chaco, RN 8:05 AM

## 2018-09-01 NOTE — Progress Notes (Signed)
Complaints of mobile right breast lump x 15 months that is painful at times. Patient states the lump has increased in size over the past month. Patient states the pain has increased over the past month and rates it at 4-5 out of 10. Patient complained of a spontaneous clear right nipple discharge that started a month ago.  Pap Smear: Pap smear completed today. Last Pap smear was 07/29/2017 at [facility] and [facility] with negative HPV. Per patient her last Pap smear  is the only abnormal Pap smear she has had. Last Pap smear result is in Epic.  Physical exam: Breasts Right breast larger than left breast that per patient has been a change over the past month. No skin abnormalities bilateral breasts. No nipple retraction bilateral breasts. No nipple discharge left breast. Expressed a scant amount of clear discharge from the left breast on exam. Sample of discharge sent to Cytology for evaluation. No lymphadenopathy left axilla. Lymphadenopathy right axilla. No lumps palpated left breast. Palpated a mobile lump within the right breast at 1 o'clock 3 cm from the nipple. No complaints of pain or tenderness on exam. Referred patient to the [facility] for a right breast ultrasound. Appointment scheduled for Thursday, September 01, 2018 at 0900.        Pelvic/Bimanual   Ext Genitalia No lesions, no swelling and no discharge observed on external genitalia.         Vagina Vagina pink and normal texture. No lesions or discharge observed in vagina.          Cervix Cervix is present. Cervix pink and of normal texture. No discharge observed.     Uterus Uterus is present and palpable. Uterus in normal position and normal size.        Adnexae Bilateral ovaries present and palpable. No tenderness on palpation.         Rectovaginal No rectal exam completed today since patient had no rectal complaints. No skin abnormalities observed on exam.    Smoking History: Patient has never  smoked.  Patient Navigation: Patient education provided. Access to services provided for patient through [facility] program.   Breast and Cervical Cancer Risk Assessment: Patient has no family history of breast cancer, known genetic mutations, or radiation treatment to the chest before age 56. Patient has no history of cervical dysplasia, immunocompromised, or DES exposure in-utero. Breast Cancer risk assessment completed. No breast cancer risk calculated due to patient is less than 50 years old.

## 2018-09-01 NOTE — Addendum Note (Signed)
Encounter addended by: Loletta Parish, RN on: 09/01/2018 11:31 AM  Actions taken: Clinical Note Signed

## 2018-09-01 NOTE — Addendum Note (Signed)
Encounter addended by: Armond Hang, LPN on: 5/46/5681 2:75 AM  Actions taken: Order list changed

## 2018-09-05 LAB — CYTOLOGY - PAP
Diagnosis: NEGATIVE
HPV: DETECTED — AB

## 2018-09-08 ENCOUNTER — Other Ambulatory Visit (HOSPITAL_COMMUNITY): Payer: Self-pay | Admitting: Nurse Practitioner

## 2018-09-08 DIAGNOSIS — D241 Benign neoplasm of right breast: Secondary | ICD-10-CM

## 2018-09-09 ENCOUNTER — Telehealth (HOSPITAL_COMMUNITY): Payer: Self-pay | Admitting: *Deleted

## 2018-09-09 NOTE — Telephone Encounter (Signed)
Patient called and left a voicemail about the recommendations for a breast MRI. Called patient back and she stated she already has a breast MRI scheduled and is being followed. Told patient [facility] does not cover the MRI. Told patient about the [facility]. Patient verbalized understanding.

## 2018-09-09 NOTE — Telephone Encounter (Signed)
Patient called with questions about her Pap smear result. Let patient know that her Pap smear was normal and HPV was positive. Informed patient that her next Pap smear is due in one year. Explained the HPV virus to patient and answered her questions. Patient verbalized understanding.

## 2018-09-13 ENCOUNTER — Other Ambulatory Visit (HOSPITAL_COMMUNITY): Payer: Self-pay | Admitting: Podiatry

## 2018-09-13 ENCOUNTER — Other Ambulatory Visit (HOSPITAL_COMMUNITY): Payer: Self-pay | Admitting: Specialist

## 2018-09-14 ENCOUNTER — Ambulatory Visit (HOSPITAL_COMMUNITY)
Admission: RE | Admit: 2018-09-14 | Discharge: 2018-09-14 | Disposition: A | Discharge status: Home / Self Care | Payer: Self-pay | Source: Ambulatory Visit | Attending: Specialist | Admitting: Specialist

## 2018-09-14 ENCOUNTER — Other Ambulatory Visit (HOSPITAL_COMMUNITY): Payer: Self-pay | Admitting: Specialist

## 2018-09-14 DIAGNOSIS — D241 Benign neoplasm of right breast: Secondary | ICD-10-CM | POA: Insufficient documentation

## 2018-09-14 LAB — POCT I-STAT CREATININE: Creatinine, Ser: 1.3 mg/dL — ABNORMAL HIGH (ref 0.44–1.00)

## 2018-09-14 MED ORDER — GADOBUTROL 1 MMOL/ML IV SOLN
5.0000 mL | Freq: Once | INTRAVENOUS | Status: AC | PRN
  Administered 2018-09-14: 5 mL via INTRAVENOUS

## 2018-09-27 ENCOUNTER — Other Ambulatory Visit: Payer: Self-pay | Admitting: General Surgery

## 2018-09-27 DIAGNOSIS — N6312 Unspecified lump in the right breast, upper inner quadrant: Secondary | ICD-10-CM

## 2018-09-28 ENCOUNTER — Other Ambulatory Visit: Payer: Self-pay | Admitting: General Surgery

## 2018-09-28 DIAGNOSIS — N6312 Unspecified lump in the right breast, upper inner quadrant: Secondary | ICD-10-CM

## 2018-10-01 ENCOUNTER — Other Ambulatory Visit: Payer: Self-pay | Admitting: General Surgery

## 2018-10-01 DIAGNOSIS — N6310 Unspecified lump in the right breast, unspecified quadrant: Secondary | ICD-10-CM

## 2018-10-03 ENCOUNTER — Encounter (HOSPITAL_BASED_OUTPATIENT_CLINIC_OR_DEPARTMENT_OTHER): Payer: Self-pay | Admitting: *Deleted

## 2018-10-03 ENCOUNTER — Other Ambulatory Visit: Payer: Self-pay

## 2018-10-06 ENCOUNTER — Encounter (HOSPITAL_BASED_OUTPATIENT_CLINIC_OR_DEPARTMENT_OTHER)
Admission: RE | Admit: 2018-10-06 | Discharge: 2018-10-06 | Disposition: A | Discharge status: Home / Self Care | Payer: Self-pay | Source: Ambulatory Visit | Attending: General Surgery | Admitting: General Surgery

## 2018-10-06 DIAGNOSIS — Z01812 Encounter for preprocedural laboratory examination: Secondary | ICD-10-CM | POA: Insufficient documentation

## 2018-10-06 LAB — COMPREHENSIVE METABOLIC PANEL
ALT: 16 U/L (ref 0–44)
AST: 23 U/L (ref 15–41)
Albumin: 3.9 g/dL (ref 3.5–5.0)
Alkaline Phosphatase: 53 U/L (ref 38–126)
Anion gap: 7 (ref 5–15)
BUN: 6 mg/dL (ref 6–20)
CO2: 23 mmol/L (ref 22–32)
Calcium: 8.6 mg/dL — ABNORMAL LOW (ref 8.9–10.3)
Chloride: 105 mmol/L (ref 98–111)
Creatinine, Ser: 0.49 mg/dL (ref 0.44–1.00)
GFR calc Af Amer: >60 mL/min (ref >60)
GFR calc non Af Amer: >60 mL/min (ref >60)
Glucose, Bld: 95 mg/dL (ref 70–99)
Potassium: 3.3 mmol/L — ABNORMAL LOW (ref 3.5–5.1)
Sodium: 135 mmol/L (ref 135–145)
Total Bilirubin: 0.4 mg/dL (ref 0.3–1.2)
Total Protein: 7.1 g/dL (ref 6.5–8.1)

## 2018-10-06 LAB — CBC WITH DIFFERENTIAL/PLATELET
Abs Immature Granulocytes: 0.01 10*3/uL (ref 0.00–0.07)
Basophils Absolute: 0 10*3/uL (ref 0.0–0.1)
Basophils Relative: 1 %
Eosinophils Absolute: 0.2 10*3/uL (ref 0.0–0.5)
Eosinophils Relative: 3 %
HCT: 40.6 % (ref 36.0–46.0)
Hemoglobin: 13 g/dL (ref 12.0–15.0)
Immature Granulocytes: 0 %
Lymphocytes Relative: 41 %
Lymphs Abs: 2.7 10*3/uL (ref 0.7–4.0)
MCH: 26.5 pg (ref 26.0–34.0)
MCHC: 32 g/dL (ref 30.0–36.0)
MCV: 82.9 fL (ref 80.0–100.0)
Monocytes Absolute: 0.5 10*3/uL (ref 0.1–1.0)
Monocytes Relative: 7 %
Neutro Abs: 3.2 10*3/uL (ref 1.7–7.7)
Neutrophils Relative %: 48 %
Platelets: 234 10*3/uL (ref 150–400)
RBC: 4.9 MIL/uL (ref 3.87–5.11)
RDW: 13.7 % (ref 11.5–15.5)
WBC: 6.6 10*3/uL (ref 4.0–10.5)
nRBC: 0 % (ref 0.0–0.2)

## 2018-10-06 LAB — POCT PREGNANCY, URINE: Preg Test, Ur: NEGATIVE

## 2018-10-06 NOTE — Progress Notes (Addendum)
Ensure pre surgery drink given with instructions to complete by [facility], pt verbalized understanding.  Labs reviewed by Dr. Roanna Banning, will proceed with surgery as scheduled.

## 2018-10-08 NOTE — H&P (Signed)
Jacqueline [name] Location: [facility] Patient #: [id number] DOB: April 19, 1996 Single / Language: Jacqueline [name] / Race: Undefined Female       History of Present Illness       This is a  23 year old Hispanic female, referred by Jacqueline [name] at the [facility] for excision of enlarging right breast mass upper inner quadrant. She does not have a PCP but has been seen by Jacqueline [name] constant at the GYN clinic at [facility] for Pap smears. MRI was authorized by a Dr. Lillia [name] on Jacqu[name]. She does not know who that Doctor is. She is alone in the office today but Jacqueline [name] served as a chaperone throughout the entire encounter.       She states that she is noticed a lump in her right breast for 2 years. She's had a spontaneous clear right nipple discharge intermittently for a few weeks. 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 Nothing further was done. The mass has enlarged and on recent imaging studies in January 2020 it is now gone from 4.7-5.7 cm. MRI was performed on January 29 and 2020. This shows a 5.6 cm mass in the right breast upper inner quadrant and the clip is within the mass. Left breast is normal. All lymph nodes are normal. Excision is recommended     Past history reveals she is on Adderall. Never had surgery. A creatinine was 1.3 family history is negative for breast or ovarian cancer Social history she is single. Never pregnant. Lives with her mother. 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 Quit alcohol and tobacco about 3 months ago.        Exam reveals lumpy glandular breast and a larger mobile nontender mass in the upper inner quadrant of the right breast. Her nipple and areolar complex looks normal. I could not elicit the discharge. She wants to have this area excised and think that's appropriate since it is enlarging. I think we will leave the subareolar area alone  since it is very unlikely to be a malignancy given the negative MRI and negative physical exam. Subareolar surgery would be high risk for future lactation, nipple sensation and erectile function. I explained this to her in detail and she is completely comfortable with this plan.      She will therefore be scheduled for right breast lumpectomy with radioactive seed localization. I discussed the indications, details, techniques, and it was risk of the surgery with her. She is aware of the risk of bleeding, infection, cosmetic deformity, reoperation of cancer, impairment of lactation in the future but hopefully not. She understands all these issues. All her questions were answered. She agrees with this plan.   Past Surgical History  Breast Biopsy  Right. Oral Surgery   Allergies Jacqueline [name], RMA; 09/27/2018 4:14 PM) No Known Drug Allergies [09/27/2018]: Allergies Reconciled   Medication History  Amphetamine-Dextroamphet ER (20MG  Capsule ER 24HR, Oral) Active.  Pregnancy / Birth History Irregular periods   Other Problems  Back Pain  Lump In Breast     Review of Systems General Present- Fatigue. Not Present- Appetite Loss, Chills, Fever, Night Sweats, Weight Gain and Weight Loss.  Vitals  Weight: 116 lb Height: 61in Body Surface Area: 1.5 m Body Mass Index: 21.92 kg/m  Temp.: 98.66F(Temporal)  Pulse: 98 (Regular)  P.OX: 99% (Room air) BP: 118/78 (Sitting, Left Arm, Standard)     Physical Exam  General Mental Status-Alert. General Appearance-Consistent with  stated age. Hydration-Well hydrated. Voice-Normal.  Head and Neck Head-normocephalic, atraumatic with no lesions or palpable masses. Trachea-midline. Thyroid Gland Characteristics - normal size and consistency.  Eye Eyeball - Bilateral-Extraocular movements intact. Sclera/Conjunctiva - Bilateral-No scleral icterus.  Chest and Lung Exam Chest and lung exam reveals  -quiet, even and easy respiratory effort with no use of accessory muscles and on auscultation, normal breath sounds, no adventitious sounds and normal vocal resonance. Inspection Chest Wall - Normal. Back - normal.  Breast Note: Breasts are medium sized. Skin healthy. Nipple and areolar complexes are normal. I cannot elicit a discharge from the right nipple. Breasts are lumpy and glandular. There is a dominant mass in the right breast upper inner quadrant. 4-5 cm size. Mobile. No axillary adenopathy on either side.   Cardiovascular Cardiovascular examination reveals -normal heart sounds, regular rate and rhythm with no murmurs and normal pedal pulses bilaterally.  Abdomen Inspection Inspection of the abdomen reveals - No Hernias. Skin - Scar - no surgical scars. Palpation/Percussion Palpation and Percussion of the abdomen reveal - Soft, Non Tender, No Rebound tenderness, No Rigidity (guarding) and No hepatosplenomegaly. Auscultation Auscultation of the abdomen reveals - Bowel sounds normal.  Neurologic Neurologic evaluation reveals -alert and oriented x 3 with no impairment of recent or remote memory. Mental Status-Normal.  Musculoskeletal Normal Exam - Left-Upper Extremity Strength Normal and Lower Extremity Strength Normal. Normal Exam - Right-Upper Extremity Strength Normal and Lower Extremity Strength Normal.  Lymphatic Head & Neck  General Head & Neck Lymphatics: Bilateral - Description - Normal. Axillary  General Axillary Region: Bilateral - Description - Normal. Tenderness - Non Tender. Femoral & Inguinal  Generalized Femoral & Inguinal Lymphatics: Bilateral - Description - Normal. Tenderness - Non Tender.    Assessment & Plan   BREAST MASS, RIGHT (N63.10)   You have an enlarging mass in the right breast, upper inner quadrant 7 months ago this was 4.7 cm and was biopsied and showed benign hyperplasia call PA SH It is now 5.7 cm YOU also report a  right nipple discharge which is nonbloody  Because this solitary mass is enlarging, I have advised conservative excision I could not reproduce or nipple discharge and the MRI does not show any evidence of malignancy behind the nipple I would avoid any surgery behind your nipple because of its effects on lactation, nipple sensation and erectile function. 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 Dalbert Batman, M.D., [facility] General and Minimally invasive Surgery Breast and Colorectal Surgery Office:   [phone] Pager:   [phone]

## 2018-10-10 ENCOUNTER — Ambulatory Visit
Admission: RE | Admit: 2018-10-10 | Discharge: 2018-10-10 | Disposition: A | Discharge status: Home / Self Care | Payer: Self-pay | Source: Ambulatory Visit | Attending: General Surgery | Admitting: General Surgery

## 2018-10-10 DIAGNOSIS — N6312 Unspecified lump in the right breast, upper inner quadrant: Secondary | ICD-10-CM

## 2018-10-12 ENCOUNTER — Ambulatory Visit (HOSPITAL_BASED_OUTPATIENT_CLINIC_OR_DEPARTMENT_OTHER): Payer: Self-pay | Admitting: Anesthesiology

## 2018-10-12 ENCOUNTER — Encounter (HOSPITAL_BASED_OUTPATIENT_CLINIC_OR_DEPARTMENT_OTHER): Payer: Self-pay | Admitting: Emergency Medicine

## 2018-10-12 ENCOUNTER — Encounter (HOSPITAL_BASED_OUTPATIENT_CLINIC_OR_DEPARTMENT_OTHER)
Admission: RE | Disposition: A | Discharge status: Home / Self Care | Payer: Self-pay | Source: Home / Self Care | Attending: General Surgery

## 2018-10-12 ENCOUNTER — Ambulatory Visit (HOSPITAL_BASED_OUTPATIENT_CLINIC_OR_DEPARTMENT_OTHER)
Admission: RE | Admit: 2018-10-12 | Discharge: 2018-10-12 | Disposition: A | Discharge status: Home / Self Care | Payer: Self-pay | Attending: General Surgery | Admitting: General Surgery

## 2018-10-12 ENCOUNTER — Ambulatory Visit
Admission: RE | Admit: 2018-10-12 | Discharge: 2018-10-12 | Disposition: A | Discharge status: Home / Self Care | Payer: Self-pay | Source: Ambulatory Visit | Attending: General Surgery | Admitting: General Surgery

## 2018-10-12 ENCOUNTER — Other Ambulatory Visit: Payer: Self-pay

## 2018-10-12 DIAGNOSIS — N6312 Unspecified lump in the right breast, upper inner quadrant: Secondary | ICD-10-CM

## 2018-10-12 DIAGNOSIS — N926 Irregular menstruation, unspecified: Secondary | ICD-10-CM | POA: Insufficient documentation

## 2018-10-12 DIAGNOSIS — D241 Benign neoplasm of right breast: Secondary | ICD-10-CM | POA: Insufficient documentation

## 2018-10-12 DIAGNOSIS — M549 Dorsalgia, unspecified: Secondary | ICD-10-CM | POA: Insufficient documentation

## 2018-10-12 DIAGNOSIS — N6310 Unspecified lump in the right breast, unspecified quadrant: Secondary | ICD-10-CM

## 2018-10-12 DIAGNOSIS — F1520 Other stimulant dependence, uncomplicated: Secondary | ICD-10-CM | POA: Insufficient documentation

## 2018-10-12 DIAGNOSIS — N6452 Nipple discharge: Secondary | ICD-10-CM | POA: Insufficient documentation

## 2018-10-12 DIAGNOSIS — Z87891 Personal history of nicotine dependence: Secondary | ICD-10-CM | POA: Insufficient documentation

## 2018-10-12 DIAGNOSIS — Z79899 Other long term (current) drug therapy: Secondary | ICD-10-CM | POA: Insufficient documentation

## 2018-10-12 HISTORY — PX: BREAST LUMPECTOMY WITH RADIOACTIVE SEED LOCALIZATION: SHX6424

## 2018-10-12 SURGERY — BREAST LUMPECTOMY WITH RADIOACTIVE SEED LOCALIZATION
Anesthesia: General | Site: Breast | Laterality: Right

## 2018-10-12 MED ORDER — FENTANYL CITRATE (PF) 100 MCG/2ML IJ SOLN
25.0000 ug | INTRAMUSCULAR | Status: DC | PRN
  Administered 2018-10-12 (×2): 50 ug via INTRAVENOUS

## 2018-10-12 MED ORDER — CEFAZOLIN SODIUM-DEXTROSE 2-4 GM/100ML-% IV SOLN: 2.0000 g | INTRAVENOUS | Status: DC

## 2018-10-12 MED ORDER — BUPIVACAINE-EPINEPHRINE (PF) 0.25% -1:200000 IJ SOLN
INTRAMUSCULAR | Status: AC
  Filled 2018-10-12: qty 30

## 2018-10-12 MED ORDER — DEXAMETHASONE SODIUM PHOSPHATE 10 MG/ML IJ SOLN
INTRAMUSCULAR | Status: AC
  Filled 2018-10-12: qty 1

## 2018-10-12 MED ORDER — GABAPENTIN 300 MG PO CAPS
300.0000 mg | ORAL_CAPSULE | ORAL | Status: AC
  Administered 2018-10-12: 300 mg via ORAL

## 2018-10-12 MED ORDER — ACETAMINOPHEN 500 MG PO TABS
ORAL_TABLET | ORAL | Status: AC
  Filled 2018-10-12: qty 2

## 2018-10-12 MED ORDER — ONDANSETRON HCL 4 MG/2ML IJ SOLN
INTRAMUSCULAR | Status: DC | PRN
  Administered 2018-10-12: 4 mg via INTRAVENOUS

## 2018-10-12 MED ORDER — ACETAMINOPHEN 500 MG PO TABS
1000.0000 mg | ORAL_TABLET | ORAL | Status: AC
  Administered 2018-10-12: 1000 mg via ORAL

## 2018-10-12 MED ORDER — CEFAZOLIN SODIUM-DEXTROSE 2-4 GM/100ML-% IV SOLN
INTRAVENOUS | Status: AC
  Filled 2018-10-12: qty 100

## 2018-10-12 MED ORDER — CEFAZOLIN SODIUM-DEXTROSE 2-4 GM/100ML-% IV SOLN
2.0000 g | INTRAVENOUS | Status: AC
  Administered 2018-10-12: 2 g via INTRAVENOUS

## 2018-10-12 MED ORDER — CELECOXIB 200 MG PO CAPS: 200.0000 mg | ORAL_CAPSULE | ORAL | Status: DC

## 2018-10-12 MED ORDER — PROPOFOL 10 MG/ML IV BOLUS
INTRAVENOUS | Status: DC | PRN
  Administered 2018-10-12: 150 mg via INTRAVENOUS

## 2018-10-12 MED ORDER — SCOPOLAMINE 1 MG/3DAYS TD PT72: 1.0000 | MEDICATED_PATCH | Freq: Once | TRANSDERMAL | Status: DC | PRN

## 2018-10-12 MED ORDER — CHLORHEXIDINE GLUCONATE CLOTH 2 % EX PADS: 6.0000 | MEDICATED_PAD | Freq: Once | CUTANEOUS | Status: DC

## 2018-10-12 MED ORDER — OXYCODONE HCL 5 MG PO TABS
ORAL_TABLET | ORAL | Status: AC
  Filled 2018-10-12: qty 1

## 2018-10-12 MED ORDER — PROMETHAZINE HCL 25 MG/ML IJ SOLN: 6.2500 mg | INTRAMUSCULAR | Status: DC | PRN

## 2018-10-12 MED ORDER — HYDROCODONE-ACETAMINOPHEN 5-325 MG PO TABS: 1.0000 | ORAL_TABLET | Freq: Four times a day (QID) | ORAL | 0 refills | Status: DC | PRN

## 2018-10-12 MED ORDER — FENTANYL CITRATE (PF) 100 MCG/2ML IJ SOLN
INTRAMUSCULAR | Status: AC
  Filled 2018-10-12: qty 2

## 2018-10-12 MED ORDER — DEXAMETHASONE SODIUM PHOSPHATE 10 MG/ML IJ SOLN
INTRAMUSCULAR | Status: DC | PRN
  Administered 2018-10-12: 4 mg via INTRAVENOUS

## 2018-10-12 MED ORDER — LIDOCAINE HCL (CARDIAC) PF 100 MG/5ML IV SOSY
PREFILLED_SYRINGE | INTRAVENOUS | Status: DC | PRN
  Administered 2018-10-12: 60 mg via INTRAVENOUS

## 2018-10-12 MED ORDER — ACETAMINOPHEN 500 MG PO TABS: 1000.0000 mg | ORAL_TABLET | ORAL | Status: DC

## 2018-10-12 MED ORDER — GABAPENTIN 300 MG PO CAPS
ORAL_CAPSULE | ORAL | Status: AC
  Filled 2018-10-12: qty 1

## 2018-10-12 MED ORDER — PROPOFOL 10 MG/ML IV BOLUS
INTRAVENOUS | Status: AC
  Filled 2018-10-12: qty 20

## 2018-10-12 MED ORDER — CELECOXIB 200 MG PO CAPS
ORAL_CAPSULE | ORAL | Status: AC
  Filled 2018-10-12: qty 1

## 2018-10-12 MED ORDER — OXYCODONE HCL 5 MG/5ML PO SOLN: 5.0000 mg | Freq: Once | ORAL | Status: AC | PRN

## 2018-10-12 MED ORDER — CELECOXIB 200 MG PO CAPS
200.0000 mg | ORAL_CAPSULE | ORAL | Status: AC
  Administered 2018-10-12: 200 mg via ORAL

## 2018-10-12 MED ORDER — OXYCODONE HCL 5 MG PO TABS
5.0000 mg | ORAL_TABLET | Freq: Once | ORAL | Status: AC | PRN
  Administered 2018-10-12: 5 mg via ORAL

## 2018-10-12 MED ORDER — MIDAZOLAM HCL 2 MG/2ML IJ SOLN
INTRAMUSCULAR | Status: AC
  Filled 2018-10-12: qty 2

## 2018-10-12 MED ORDER — FENTANYL CITRATE (PF) 100 MCG/2ML IJ SOLN
50.0000 ug | INTRAMUSCULAR | Status: DC | PRN
  Administered 2018-10-12 (×2): 50 ug via INTRAVENOUS

## 2018-10-12 MED ORDER — BUPIVACAINE-EPINEPHRINE 0.25% -1:200000 IJ SOLN
INTRAMUSCULAR | Status: DC | PRN
  Administered 2018-10-12: 10 mL

## 2018-10-12 MED ORDER — MIDAZOLAM HCL 2 MG/2ML IJ SOLN
1.0000 mg | INTRAMUSCULAR | Status: DC | PRN
  Administered 2018-10-12: 1 mg via INTRAVENOUS

## 2018-10-12 MED ORDER — GABAPENTIN 300 MG PO CAPS: 300.0000 mg | ORAL_CAPSULE | ORAL | Status: DC

## 2018-10-12 MED ORDER — ONDANSETRON HCL 4 MG/2ML IJ SOLN
INTRAMUSCULAR | Status: AC
  Filled 2018-10-12: qty 2

## 2018-10-12 MED ORDER — LACTATED RINGERS IV SOLN
INTRAVENOUS | Status: DC
  Administered 2018-10-12: 14:00:00 via INTRAVENOUS

## 2018-10-12 MED ORDER — LIDOCAINE 2% (20 MG/ML) 5 ML SYRINGE
INTRAMUSCULAR | Status: AC
  Filled 2018-10-12: qty 5

## 2018-10-12 SURGICAL SUPPLY — 63 items
APPLIER CLIP 9.375 MED OPEN (MISCELLANEOUS) ×3
BENZOIN TINCTURE PRP APPL 2/3 (GAUZE/BANDAGES/DRESSINGS) IMPLANT
BINDER BREAST LRG (GAUZE/BANDAGES/DRESSINGS) IMPLANT
BINDER BREAST MEDIUM (GAUZE/BANDAGES/DRESSINGS) ×3 IMPLANT
BINDER BREAST XLRG (GAUZE/BANDAGES/DRESSINGS) IMPLANT
BINDER BREAST XXLRG (GAUZE/BANDAGES/DRESSINGS) IMPLANT
BLADE HEX COATED 2.75 (ELECTRODE) ×3 IMPLANT
BLADE SURG 10 STRL SS (BLADE) IMPLANT
BLADE SURG 15 STRL LF DISP TIS (BLADE) ×1 IMPLANT
BLADE SURG 15 STRL SS (BLADE) ×2
CANISTER SUC SOCK COL 7IN (MISCELLANEOUS) IMPLANT
CANISTER SUCT 1200ML W/VALVE (MISCELLANEOUS) ×3 IMPLANT
CHLORAPREP W/TINT 26ML (MISCELLANEOUS) ×3 IMPLANT
CLIP APPLIE 9.375 MED OPEN (MISCELLANEOUS) ×1 IMPLANT
CLOSURE WOUND 1/2 X4 (GAUZE/BANDAGES/DRESSINGS)
COVER BACK TABLE 60X90IN (DRAPES) ×3 IMPLANT
COVER MAYO STAND STRL (DRAPES) ×3 IMPLANT
COVER PROBE W GEL 5X96 (DRAPES) ×3 IMPLANT
COVER WAND RF STERILE (DRAPES) IMPLANT
DECANTER SPIKE VIAL GLASS SM (MISCELLANEOUS) IMPLANT
DERMABOND ADVANCED (GAUZE/BANDAGES/DRESSINGS) ×2
DERMABOND ADVANCED .7 DNX12 (GAUZE/BANDAGES/DRESSINGS) ×1 IMPLANT
DRAPE LAPAROSCOPIC ABDOMINAL (DRAPES) ×3 IMPLANT
DRAPE UTILITY XL STRL (DRAPES) ×3 IMPLANT
DRSG PAD ABDOMINAL 8X10 ST (GAUZE/BANDAGES/DRESSINGS) ×3 IMPLANT
ELECT REM PT RETURN 9FT ADLT (ELECTROSURGICAL) ×3
ELECTRODE REM PT RTRN 9FT ADLT (ELECTROSURGICAL) ×1 IMPLANT
GAUZE SPONGE 4X4 12PLY STRL LF (GAUZE/BANDAGES/DRESSINGS) ×3 IMPLANT
GLOVE BIOGEL PI IND STRL 7.0 (GLOVE) ×1 IMPLANT
GLOVE BIOGEL PI INDICATOR 7.0 (GLOVE) ×2
GLOVE ECLIPSE 6.5 STRL STRAW (GLOVE) ×3 IMPLANT
GLOVE EUDERMIC 7 POWDERFREE (GLOVE) ×6 IMPLANT
GLOVE EXAM NITRILE MD LF STRL (GLOVE) ×3 IMPLANT
GOWN STRL REUS W/ TWL LRG LVL3 (GOWN DISPOSABLE) ×1 IMPLANT
GOWN STRL REUS W/ TWL XL LVL3 (GOWN DISPOSABLE) ×1 IMPLANT
GOWN STRL REUS W/TWL LRG LVL3 (GOWN DISPOSABLE) ×2
GOWN STRL REUS W/TWL XL LVL3 (GOWN DISPOSABLE) ×2
ILLUMINATOR WAVEGUIDE N/F (MISCELLANEOUS) IMPLANT
KIT MARKER MARGIN INK (KITS) ×3 IMPLANT
LIGHT WAVEGUIDE WIDE FLAT (MISCELLANEOUS) IMPLANT
NEEDLE HYPO 25X1 1.5 SAFETY (NEEDLE) ×3 IMPLANT
NS IRRIG 1000ML POUR BTL (IV SOLUTION) ×3 IMPLANT
PACK BASIN DAY SURGERY FS (CUSTOM PROCEDURE TRAY) ×3 IMPLANT
PENCIL BUTTON HOLSTER BLD 10FT (ELECTRODE) ×3 IMPLANT
SHEET MEDIUM DRAPE 40X70 STRL (DRAPES) IMPLANT
SLEEVE SCD COMPRESS KNEE MED (MISCELLANEOUS) ×3 IMPLANT
SPONGE LAP 18X18 RF (DISPOSABLE) IMPLANT
SPONGE LAP 4X18 RFD (DISPOSABLE) ×3 IMPLANT
STRIP CLOSURE SKIN 1/2X4 (GAUZE/BANDAGES/DRESSINGS) IMPLANT
SUT ETHILON 3 0 FSL (SUTURE) IMPLANT
SUT MNCRL AB 4-0 PS2 18 (SUTURE) ×3 IMPLANT
SUT SILK 2 0 SH (SUTURE) ×3 IMPLANT
SUT VIC AB 2-0 CT1 27 (SUTURE)
SUT VIC AB 2-0 CT1 TAPERPNT 27 (SUTURE) IMPLANT
SUT VIC AB 3-0 SH 27 (SUTURE)
SUT VIC AB 3-0 SH 27X BRD (SUTURE) IMPLANT
SUT VICRYL 3-0 CR8 SH (SUTURE) ×3 IMPLANT
SYR 10ML LL (SYRINGE) ×3 IMPLANT
TOWEL GREEN STERILE FF (TOWEL DISPOSABLE) ×3 IMPLANT
TRAY FAXITRON CT DISP (TRAY / TRAY PROCEDURE) ×3 IMPLANT
TUBE CONNECTING 20'X1/4 (TUBING) ×1
TUBE CONNECTING 20X1/4 (TUBING) ×2 IMPLANT
YANKAUER SUCT BULB TIP NO VENT (SUCTIONS) ×3 IMPLANT

## 2018-10-12 NOTE — Anesthesia Preprocedure Evaluation (Signed)
Anesthesia Evaluation  Patient identified by MRN, date of birth, ID band Patient awake    Reviewed: Allergy & Precautions, NPO status , Patient's Chart, lab work & pertinent test results  Airway Mallampati: II  TM Distance: >3 FB Neck ROM: Full    Dental  (+) Dental Advisory Given   Pulmonary neg pulmonary ROS,    breath sounds clear to auscultation       Cardiovascular negative cardio ROS   Rhythm:Regular Rate:Normal     Neuro/Psych negative neurological ROS  negative psych ROS   GI/Hepatic negative GI ROS, Neg liver ROS,   Endo/Other  negative endocrine ROS  Renal/GU negative Renal ROS  negative genitourinary   Musculoskeletal negative musculoskeletal ROS (+)   Abdominal   Peds negative pediatric ROS (+)  Hematology negative hematology ROS (+)   Anesthesia Other Findings   Reproductive/Obstetrics negative OB ROS                             Lab Results  Component Value Date   WBC 6.6 10/06/2018   HGB 13.0 10/06/2018   HCT 40.6 10/06/2018   MCV 82.9 10/06/2018   PLT 234 10/06/2018   Lab Results  Component Value Date   CREATININE 0.49 10/06/2018   BUN 6 10/06/2018   NA 135 10/06/2018   K 3.3 (L) 10/06/2018   CL 105 10/06/2018   CO2 23 10/06/2018    Anesthesia Physical Anesthesia Plan  ASA: I  Anesthesia Plan: General   Post-op Pain Management:    Induction: Intravenous  PONV Risk Score and Plan: 3 and Dexamethasone, Treatment may vary due to age or medical condition, Ondansetron and Midazolam  Airway Management Planned: LMA  Additional Equipment:   Intra-op Plan:   Post-operative Plan: Extubation in OR  Informed Consent: I have reviewed the patients History and Physical, chart, labs and discussed the procedure including the risks, benefits and alternatives for the proposed anesthesia with the patient or authorized representative who has indicated his/her  understanding and acceptance.     Dental advisory given  Plan Discussed with: CRNA  Anesthesia Plan Comments:         Anesthesia Quick Evaluation

## 2018-10-12 NOTE — Anesthesia Postprocedure Evaluation (Signed)
Anesthesia Post Note  Patient: Jacqueline [name]  Procedure(s) Performed: RIGHT BREAST LUMPECTOMY WITH RADIOACTIVE SEED LOCALIZATION (Right Breast)     Patient location during evaluation: PACU Anesthesia Type: General Level of consciousness: awake and alert Pain management: pain level controlled Vital Signs Assessment: post-procedure vital signs reviewed and stable Respiratory status: spontaneous breathing, nonlabored ventilation and respiratory function stable Cardiovascular status: blood pressure returned to baseline and stable Postop Assessment: no apparent nausea or vomiting Anesthetic complications: no    Last Vitals:  Vitals:   10/12/18 1700 10/12/18 1715  BP: 106/63 104/65  Pulse: 73 83  Resp: 11 (!) 22  Temp:    SpO2: 100% 100%    Last Pain:  Vitals:   10/12/18 1700  TempSrc:   PainSc: Lott

## 2018-10-12 NOTE — Transfer of Care (Signed)
Immediate Anesthesia Transfer of Care Note  Patient: Jacqueline [name]  Procedure(s) Performed: RIGHT BREAST LUMPECTOMY WITH RADIOACTIVE SEED LOCALIZATION (Right Breast)  Patient Location: PACU  Anesthesia Type:General  Level of Consciousness: awake, alert  and oriented  Airway & Oxygen Therapy: Patient Spontanous Breathing and Patient connected to face mask oxygen  Post-op Assessment: Report given to RN and Post -op Vital signs reviewed and stable  Post vital signs: Reviewed and stable  Last Vitals:  Vitals Value Taken Time  BP 97/64 10/12/2018  4:39 PM  Temp    Pulse 72 10/12/2018  4:40 PM  Resp 13 10/12/2018  4:40 PM  SpO2 100 % 10/12/2018  4:40 PM  Vitals shown include unvalidated device data.  Last Pain:  Vitals:   10/12/18 1344  TempSrc: Oral  PainSc: 0-No pain         Complications: No apparent anesthesia complications

## 2018-10-12 NOTE — Interval H&P Note (Signed)
History and Physical Interval Note:  10/12/2018 3:41 PM  Jacqueline [name]  has presented today for surgery, with the diagnosis of RIGHT BREAST ENLARGING MASS  The various methods of treatment have been discussed with the patient and family. After consideration of risks, benefits and other options for treatment, the patient has consented to  Procedure(s): RIGHT BREAST LUMPECTOMY WITH RADIOACTIVE SEED LOCALIZATION (Right) as a surgical intervention .  The patient's history has been reviewed, patient examined, no change in status, stable for surgery.  I have reviewed the patient's chart and labs.  Questions were answered to the patient's satisfaction.     Adin Hector

## 2018-10-12 NOTE — Anesthesia Procedure Notes (Signed)
Procedure Name: LMA Insertion Date/Time: 10/12/2018 3:56 PM Performed by: Jonna Munro, CRNA Pre-anesthesia Checklist: Patient identified, Emergency Drugs available, Suction available, Patient being monitored and Timeout performed Patient Re-evaluated:Patient Re-evaluated prior to induction Oxygen Delivery Method: Circle system utilized Preoxygenation: Pre-oxygenation with 100% oxygen Induction Type: IV induction LMA: LMA inserted LMA Size: 3.0 Number of attempts: 1 Tube secured with: Tape Dental Injury: Teeth and Oropharynx as per pre-operative assessment

## 2018-10-12 NOTE — Discharge Instructions (Signed)
Post Anesthesia Home Care Instructions  Activity: Get plenty of rest for the remainder of the day. A responsible individual must stay with you for 24 hours following the procedure.  For the next 24 hours, DO NOT: -Drive a car -Paediatric nurse -Drink alcoholic beverages -Take any medication unless instructed by your physician -Make any legal decisions or sign important papers.  Meals: Start with liquid foods such as gelatin or soup. Progress to regular foods as tolerated. Avoid greasy, spicy, heavy foods. If nausea and/or vomiting occur, drink only clear liquids until the nausea and/or vomiting subsides. Call your physician if vomiting continues.  Special Instructions/Symptoms: Your throat may feel dry or sore from the anesthesia or the breathing tube placed in your throat during surgery. If this causes discomfort, gargle with warm salt water. The discomfort should disappear within 24 hours.  If you had a scopolamine patch placed behind your ear for the management of post- operative nausea and/or vomiting:  1. The medication in the patch is effective for 72 hours, after which it should be removed.  Wrap patch in a tissue and discard in the trash. Wash hands thoroughly with soap and water. 2. You may remove the patch earlier than 72 hours if you experience unpleasant side effects which may include dry mouth, dizziness or visual disturbances. 3. Avoid touching the patch. Wash your hands with soap and water after contact with the patch.      [facility] Phone Number [phone]  BREAST BIOPSY/ PARTIAL MASTECTOMY: POST OP INSTRUCTIONS  Always review your discharge instruction sheet given to you by the facility where your surgery was performed.  IF YOU HAVE DISABILITY OR FAMILY LEAVE FORMS, YOU MUST BRING THEM TO THE OFFICE FOR PROCESSING.  DO NOT GIVE THEM TO YOUR DOCTOR.  1. A prescription for pain medication may be given to you upon discharge.  Take your  pain medication as prescribed, if needed.  If narcotic pain medicine is not needed, then you may take acetaminophen (Tylenol) or ibuprofen (Advil) as needed. 2. Take your usually prescribed medications unless otherwise directed 3. If you need a refill on your pain medication, please contact your pharmacy.  They will contact our office to request authorization.  Prescriptions will not be filled after 5pm or on week-ends. 4. You should eat very light the first 24 hours after surgery, such as soup, crackers, pudding, etc.  Resume your normal diet the day after surgery. 5. Most patients will experience some swelling and bruising in the breast.  Ice packs and a good support bra will help.  Swelling and bruising can take several days to resolve.  6. It is common to experience some constipation if taking pain medication after surgery.  Increasing fluid intake and taking a stool softener will usually help or prevent this problem from occurring.  A mild laxative (Milk of Magnesia or Miralax) should be taken according to package directions if there are no bowel movements after 48 hours. 7. Unless discharge instructions indicate otherwise, you may remove your bandages 24-48 hours after surgery, and you may shower at that time.  You may have steri-strips (small skin tapes) in place directly over the incision.  These strips should be left on the skin for 7-10 days.  If your surgeon used skin glue on the incision, you may shower in 24 hours.  The glue will flake off over the next 2-3 weeks.  Any sutures or staples will be removed at the office during your follow-up visit. 8.  ACTIVITIES:  You may resume regular daily activities (gradually increasing) beginning the next day.  Wearing a good support bra or sports bra minimizes pain and swelling.  You may have sexual intercourse when it is comfortable. a. You may drive when you no longer are taking prescription pain medication, you can comfortably wear a seatbelt, and you can  safely maneuver your car and apply brakes. b. RETURN TO WORK:  ______________________________________________________________________________________ 9. You should see your doctor in the office for a follow-up appointment approximately two weeks after your surgery.  Your doctors nurse will typically make your follow-up appointment when she calls you with your pathology report.  Expect your pathology report 2-3 business days after your surgery.  You may call to check if you do not hear from [place] after three days. 10. OTHER INSTRUCTIONS: _______________________________________________________________________________________________ _____________________________________________________________________________________________________________________________________ _____________________________________________________________________________________________________________________________________ _____________________________________________________________________________________________________________________________________  WHEN TO CALL YOUR DOCTOR: 1. Fever over 101.0 2. Nausea and/or vomiting. 3. Extreme swelling or bruising. 4. Continued bleeding from incision. 5. Increased pain, redness, or drainage from the incision.  The clinic staff is available to answer your questions during regular business hours.  Please dont hesitate to call and ask to speak to one of the nurses for clinical concerns.  If you have a medical emergency, go to the nearest emergency room or call 911.  A surgeon from [facility] is always on call at the hospital.  For further questions, please visit [email or web address]                Managing Your Pain After Surgery Without Opioids    Thank you for participating in our program to help patients manage their pain after surgery without opioids. This is part of our effort to provide you with the best care possible, without exposing  you or your family to the risk that opioids pose.  What pain can I expect after surgery? You can expect to have some pain after surgery. This is normal. The pain is typically worse the day after surgery, and quickly begins to get better. Many studies have found that many patients are able to manage their pain after surgery with Over-the-Counter (OTC) medications such as Tylenol and Motrin. If you have a condition that does not allow you to take Tylenol or Motrin, notify your surgical team.  How will I manage my pain? The best strategy for controlling your pain after surgery is around the clock pain control with Tylenol (acetaminophen) and Motrin (ibuprofen or Advil). Alternating these medications with each other allows you to maximize your pain control. In addition to Tylenol and Motrin, you can use heating pads or ice packs on your incisions to help reduce your pain.  How will I alternate your regular strength over-the-counter pain medication? You will take a dose of pain medication every three hours. ; Start by taking 650 mg of Tylenol (2 pills of 325 mg) ; 3 hours later take 600 mg of Motrin (3 pills of 200 mg) ; 3 hours after taking the Motrin take 650 mg of Tylenol ; 3 hours after that take 600 mg of Motrin.   - 1 -  See example - if your first dose of Tylenol is at 12:00 PM   12:00 PM Tylenol 650 mg (2 pills of 325 mg)  3:00 PM Motrin 600 mg (3 pills of 200 mg)  6:00 PM Tylenol 650 mg (2 pills of 325 mg)  9:00 PM Motrin 600 mg (3 pills of 200 mg)  Continue alternating every 3 hours  We recommend that you follow this schedule around-the-clock for at least 3 days after surgery, or until you feel that it is no longer needed. Use the table on the last page of this handout to keep track of the medications you are taking. Important: Do not take more than 3000mg  of Tylenol or 3200mg  of Motrin in a 24-hour period. *Do not take Tylenol or Ibuprofen until after 8pm day of surgery Do not  take ibuprofen/Motrin if you have a history of bleeding stomach ulcers, severe kidney disease, &/or actively taking a blood thinner  What if I still have pain? If you have pain that is not controlled with the over-the-counter pain medications (Tylenol and Motrin or Advil) you might have what we call breakthrough pain. You will receive a prescription for a small amount of an opioid pain medication such as Oxycodone, Tramadol, or Tylenol with Codeine. Use these opioid pills in the first 24 hours after surgery if you have breakthrough pain. Do not take more than 1 pill every 4-6 hours.  If you still have uncontrolled pain after using all opioid pills, don't hesitate to call our staff using the number provided. We will help make sure you are managing your pain in the best way possible, and if necessary, we can provide a prescription for additional pain medication.   Day 1    Time  Name of Medication Number of pills taken  Amount of Acetaminophen  Pain Level   Comments  AM PM       AM PM       AM PM       AM PM       AM PM       AM PM       AM PM       AM PM       Total Daily amount of Acetaminophen Do not take more than  3,000 mg per day      Day 2    Time  Name of Medication Number of pills taken  Amount of Acetaminophen  Pain Level   Comments  AM PM       AM PM       AM PM       AM PM       AM PM       AM PM       AM PM       AM PM       Total Daily amount of Acetaminophen Do not take more than  3,000 mg per day      Day 3    Time  Name of Medication Number of pills taken  Amount of Acetaminophen  Pain Level   Comments  AM PM       AM PM       AM PM       AM PM          AM PM       AM PM       AM PM       AM PM       Total Daily amount of Acetaminophen Do not take more than  3,000 mg per day      Day 4    Time  Name of Medication Number of pills taken  Amount of Acetaminophen  Pain Level   Comments  AM PM       AM PM       AM  PM         AM PM       AM PM       AM PM       AM PM       AM PM       Total Daily amount of Acetaminophen Do not take more than  3,000 mg per day      Day 5    Time  Name of Medication Number of pills taken  Amount of Acetaminophen  Pain Level   Comments  AM PM       AM PM       AM PM       AM PM       AM PM       AM PM       AM PM       AM PM       Total Daily amount of Acetaminophen Do not take more than  3,000 mg per day       Day 6    Time  Name of Medication Number of pills taken  Amount of Acetaminophen  Pain Level  Comments  AM PM       AM PM       AM PM       AM PM       AM PM       AM PM       AM PM       AM PM       Total Daily amount of Acetaminophen Do not take more than  3,000 mg per day      Day 7    Time  Name of Medication Number of pills taken  Amount of Acetaminophen  Pain Level   Comments  AM PM       AM PM       AM PM       AM PM       AM PM       AM PM       AM PM       AM PM       Total Daily amount of Acetaminophen Do not take more than  3,000 mg per day        For additional information about how and where to safely dispose of unused opioid medications - [employer]  Disclaimer: This document contains information and/or instructional materials adapted from [place] for the typical patient with your condition. It does not replace medical advice from your health care provider because your experience may differ from that of the typical patient. Talk to your health care provider if you have any questions about this document, your condition or your treatment plan. Adapted from [place] Your Pain After Surgery Without Opioids    Thank you for participating in our program to help patients manage their pain after surgery without opioids. This is part of our effort to provide you with the best care possible, without exposing you or your family to the risk that  opioids pose.  What pain can I expect after surgery? You can expect to have some pain after surgery. This is normal. The pain is typically worse the day after surgery, and quickly begins to get better. Many studies have found that many patients are able to manage their pain after surgery with Over-the-Counter (OTC) medications such as Tylenol and Motrin. If you have a  condition that does not allow you to take Tylenol or Motrin, notify your surgical team.  How will I manage my pain? The best strategy for controlling your pain after surgery is around the clock pain control with Tylenol (acetaminophen) and Motrin (ibuprofen or Advil). Alternating these medications with each other allows you to maximize your pain control. In addition to Tylenol and Motrin, you can use heating pads or ice packs on your incisions to help reduce your pain.  How will I alternate your regular strength over-the-counter pain medication? You will take a dose of pain medication every three hours. ; Start by taking 650 mg of Tylenol (2 pills of 325 mg) ; 3 hours later take 600 mg of Motrin (3 pills of 200 mg) ; 3 hours after taking the Motrin take 650 mg of Tylenol ; 3 hours after that take 600 mg of Motrin.   - 1 -  See example - if your first dose of Tylenol is at 12:00 PM   12:00 PM Tylenol 650 mg (2 pills of 325 mg)  3:00 PM Motrin 600 mg (3 pills of 200 mg)  6:00 PM Tylenol 650 mg (2 pills of 325 mg)  9:00 PM Motrin 600 mg (3 pills of 200 mg)  Continue alternating every 3 hours   We recommend that you follow this schedule around-the-clock for at least 3 days after surgery, or until you feel that it is no longer needed. Use the table on the last page of this handout to keep track of the medications you are taking. Important: Do not take more than 3000mg  of Tylenol or 3200mg  of Motrin in a 24-hour period. Do not take ibuprofen/Motrin if you have a history of bleeding stomach ulcers, severe kidney disease, &/or  actively taking a blood thinner  What if I still have pain? If you have pain that is not controlled with the over-the-counter pain medications (Tylenol and Motrin or Advil) you might have what we call breakthrough pain. You will receive a prescription for a small amount of an opioid pain medication such as Oxycodone, Tramadol, or Tylenol with Codeine. Use these opioid pills in the first 24 hours after surgery if you have breakthrough pain. Do not take more than 1 pill every 4-6 hours.  If you still have uncontrolled pain after using all opioid pills, don't hesitate to call our staff using the number provided. We will help make sure you are managing your pain in the best way possible, and if necessary, we can provide a prescription for additional pain medication.   Day 1    Time  Name of Medication Number of pills taken  Amount of Acetaminophen  Pain Level   Comments  AM PM       AM PM       AM PM       AM PM       AM PM       AM PM       AM PM       AM PM       Total Daily amount of Acetaminophen Do not take more than  3,000 mg per day      Day 2    Time  Name of Medication Number of pills taken  Amount of Acetaminophen  Pain Level   Comments  AM PM       AM PM       AM PM       AM PM  AM PM       AM PM       AM PM       AM PM       Total Daily amount of Acetaminophen Do not take more than  3,000 mg per day      Day 3    Time  Name of Medication Number of pills taken  Amount of Acetaminophen  Pain Level   Comments  AM PM       AM PM       AM PM       AM PM          AM PM       AM PM       AM PM       AM PM       Total Daily amount of Acetaminophen Do not take more than  3,000 mg per day      Day 4    Time  Name of Medication Number of pills taken  Amount of Acetaminophen  Pain Level   Comments  AM PM       AM PM       AM PM       AM PM       AM PM       AM PM       AM PM       AM PM       Total Daily amount of  Acetaminophen Do not take more than  3,000 mg per day      Day 5    Time  Name of Medication Number of pills taken  Amount of Acetaminophen  Pain Level   Comments  AM PM       AM PM       AM PM       AM PM       AM PM       AM PM       AM PM       AM PM       Total Daily amount of Acetaminophen Do not take more than  3,000 mg per day       Day 6    Time  Name of Medication Number of pills taken  Amount of Acetaminophen  Pain Level  Comments  AM PM       AM PM       AM PM       AM PM       AM PM       AM PM       AM PM       AM PM       Total Daily amount of Acetaminophen Do not take more than  3,000 mg per day      Day 7    Time  Name of Medication Number of pills taken  Amount of Acetaminophen  Pain Level   Comments  AM PM       AM PM       AM PM       AM PM       AM PM       AM PM       AM PM       AM PM       Total Daily amount of Acetaminophen Do not take more than  3,000 mg per day  For additional information about how and where to safely dispose of unused opioid medications - [employer]  Disclaimer: This document contains information and/or instructional materials adapted from [place] for the typical patient with your condition. It does not replace medical advice from your health care provider because your experience may differ from that of the typical patient. Talk to your health care provider if you have any questions about this document, your condition or your treatment plan. Adapted from [facility]

## 2018-10-12 NOTE — Op Note (Signed)
Patient Name:           Jacqueline [name]   Date of Surgery:        10/12/2018  Pre op Diagnosis:      Enlarging right breast mass  Post op Diagnosis:    5 cm fibroadenoma right breast, suspected  Procedure:                 Right breast lumpectomy with radioactive seed localization  Surgeon:                     Edsel Petrin. Dalbert Batman, M.D., FACS  Assistant:                      OR staff  Operative Indications    This is a  23 year old Hispanic female, referred by [name] at the [facility] for excision of enlarging right breast mass upper inner quadrant.  MRI was authorized by a Dr. [name] on [name]       She states that she is noticed a lump in her right breast for 2 years. She's had a spontaneous clear right nipple discharge intermittently for a few weeks. 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 Nothing further was done. The mass has enlarged and on recent imaging studies in January 2020 it is now gone from 4.7-5.7 cm. MRI was performed on January 29 and 2020. This shows a 5.6 cm mass in the right breast upper inner quadrant and the clip is within the mass. Left breast is normal. All lymph nodes are normal. Excision is recommended     Past history reveals she is on Adderall.  family history is negative for breast or ovarian cancer        Exam reveals lumpy glandular breast and a larger mobile nontender mass in the upper inner quadrant of the right breast. Her nipple and areolar complex looks normal. I could not elicit the discharge. She wants to have this area excised and think that's appropriate since it is enlarging. I think we will leave the subareolar area alone since it is very unlikely to be a malignancy given the negative MRI and negative physical exam. Subareolar surgery would be high risk for future lactation, nipple sensation and erectile function. I explained this to her in detail and  she is completely comfortable with this plan.      She will therefore be scheduled for right breast lumpectomy with radioactive seed localization.   Operative Findings:       There was a 5 cm tumor at the 12 o'clock position of the right breast, about 3 cm superior to the areolar margin.  This was quite mobile so I was able to remove this with a circumareolar incision, hidden scar technique.  This appeared to be a firm rubbery well encapsulated tumor consistent with fibroadenoma.  It was completely excised.  Specimen mammogram looked good containing the radioactive seed and the original biopsy clip.  Procedure in Detail:          Following the induction of general LMA anesthesia the patient's right breast was prepped and draped in a sterile fashion.  Intravenous antibiotics were given.  Surgical timeout was performed.  0.25% Marcaine with epinephrine was used as a local infiltration anesthetic.       I used the neoprobe to identify the radioactive seed which appeared to be within the tumor.  I  used it frequently during the case.  I made a superiorly placed incision at the areolar margin.  Dissection was carried superiorly into the breast until I identified the tumor.  I then slowly dissected away from the surrounding tissues.  It dissected out like a large fibroadenoma.  The tumor was removed and marked with silk sutures and a 6 color ink kit.  Specimen mammogram looked good as described above.  The specimen was marked with a 6 color ink kit and silk sutures and sent to the lab where the seed was retrieved.  Hemostasis was excellent.  The wound was irrigated.  I placed 4 metal marker clips in the walls of the lumpectomy cavity.  The lumpectomy cavity was closed in layers with interrupted 3-0 Vicryl and the skin closed with a running subcuticular 4-0 Monocryl and Dermabond.  Clean bandages and a breast binder were placed.  The patient tolerated the procedure well and was taken to PACU in stable condition.  EBL  10 cc.  Counts correct.  Complications none.    Addendum: I logged onto the PMP aware website and reviewed her prescription medication history      Jimmye Wisnieski M. Dalbert Batman, M.D., FACS General and Minimally Invasive Surgery Breast and Colorectal Surgery  10/12/2018 4:31 PM

## 2018-10-13 ENCOUNTER — Encounter (HOSPITAL_BASED_OUTPATIENT_CLINIC_OR_DEPARTMENT_OTHER): Payer: Self-pay | Admitting: General Surgery

## 2019-03-23 ENCOUNTER — Inpatient Hospital Stay (HOSPITAL_COMMUNITY)
Admission: RE | Admit: 2019-03-23 | Discharge: 2019-03-27 | DRG: 880 | Disposition: A | Discharge status: Home / Self Care | Payer: Federal, State, Local not specified - Other | Attending: Psychiatry | Admitting: Psychiatry

## 2019-03-23 DIAGNOSIS — F209 Schizophrenia, unspecified: Secondary | ICD-10-CM | POA: Diagnosis present

## 2019-03-23 DIAGNOSIS — F23 Brief psychotic disorder: Secondary | ICD-10-CM | POA: Diagnosis present

## 2019-03-23 DIAGNOSIS — Z20828 Contact with and (suspected) exposure to other viral communicable diseases: Secondary | ICD-10-CM | POA: Diagnosis present

## 2019-03-23 DIAGNOSIS — F410 Panic disorder [episodic paroxysmal anxiety] without agoraphobia: Principal | ICD-10-CM | POA: Diagnosis present

## 2019-03-23 DIAGNOSIS — Z79899 Other long term (current) drug therapy: Secondary | ICD-10-CM | POA: Diagnosis not present

## 2019-03-23 DIAGNOSIS — F909 Attention-deficit hyperactivity disorder, unspecified type: Secondary | ICD-10-CM | POA: Diagnosis present

## 2019-03-23 DIAGNOSIS — R45851 Suicidal ideations: Secondary | ICD-10-CM | POA: Diagnosis present

## 2019-03-23 DIAGNOSIS — F1220 Cannabis dependence, uncomplicated: Secondary | ICD-10-CM | POA: Diagnosis present

## 2019-03-23 DIAGNOSIS — Z79891 Long term (current) use of opiate analgesic: Secondary | ICD-10-CM

## 2019-03-23 LAB — SARS CORONAVIRUS 2 BY RT PCR (HOSPITAL ORDER, PERFORMED IN ~~LOC~~ HOSPITAL LAB): SARS Coronavirus 2: NEGATIVE

## 2019-03-23 MED ORDER — MAGNESIUM HYDROXIDE 400 MG/5ML PO SUSP: 30.0000 mL | Freq: Every day | ORAL | Status: DC | PRN

## 2019-03-23 MED ORDER — HYDROXYZINE HCL 25 MG PO TABS
25.0000 mg | ORAL_TABLET | Freq: Three times a day (TID) | ORAL | Status: DC | PRN
  Administered 2019-03-23: 23:00:00 25 mg via ORAL
  Filled 2019-03-23: qty 1

## 2019-03-23 MED ORDER — ACETAMINOPHEN 325 MG PO TABS: 650.0000 mg | ORAL_TABLET | Freq: Four times a day (QID) | ORAL | Status: DC | PRN

## 2019-03-23 MED ORDER — ALUM & MAG HYDROXIDE-SIMETH 200-200-20 MG/5ML PO SUSP: 30.0000 mL | ORAL | Status: DC | PRN

## 2019-03-23 MED ORDER — TRAZODONE HCL 50 MG PO TABS
50.0000 mg | ORAL_TABLET | Freq: Every evening | ORAL | Status: DC | PRN
  Administered 2019-03-23: 23:00:00 50 mg via ORAL
  Filled 2019-03-23: qty 1
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
## 2019-03-23 NOTE — BH Assessment (Signed)
Assessment Note  Jacqueline [name] is an 23 y.o. female who presented as a walk-in with her brother,  Jacqueline [name] seeking help for her anxiety.  Patient has been diagnosed with ADHD.  She states that since the Pandemic that her anxiety level has been horrible.  She states that she has not been able to sleep and feeling like she would be better off dead.  She states that she has been having suicidal thoughts, but has no plan.  Patient states that she has never attempted suicide in the past and states that she has never been in a psychiatric hospital, but states that she sees an unknown psychiatrist for her ADHD.  Patient denies HI, but states that she has been feeling like the television is controlling her and she states that she also had to stop listening to music for some unknown reason. Patient states that she has nightmares.  States that she has control over her nightmares.  She states that she can predict weather events.  Patient states that she drinks alcohol on occasion, but only averages once monthly.  She states that she has been using a lot of CBD products. Patient states that she has been experiencing racing thoughts.  Patient states that she has been under a lot of stress lately and experiencing personal issues, but would not elaborate.  TTS spoke to patient's brother who states that patient is not acting like herself.  He states that she has been having severe mood swings and rage, but will not talk about what is going on.  He states that things have been getting worse due to the pandemic.  He states that since the pandemic that she lost her job, her place to live and she had to move in with him  He states last month that she walked away from the house without telling someone, got into a car with a stranger and was gone for several days.  He states that they had filed a missing person's report.  He states that in her right mind that she would never do anything like that.  He states that she has  been very anxious lately.  He states that she has been saying that everyone would be better off if she was dead.  Patient presented with a flat and blunted affect.  Her thinking was very disorganized and it was hard to connect the dots to figure out what she was saying.  She answered questions, but not appropriately.  There was some thought blocking occurring and possibly psychosis-auditory hallucinations.  Her mood was very depressed.  Her memory was not intact.  Her judgment, insight and impulse control were most definitely impaired.  Her eye contact was clear, her speech coherent, but she had a difficult time formulating a sentence that made sense.   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 ADHD     Past Surgical History:  Procedure Laterality Date  . BREAST LUMPECTOMY WITH RADIOACTIVE SEED LOCALIZATION Right 10/12/2018   Procedure: RIGHT BREAST LUMPECTOMY WITH RADIOACTIVE SEED LOCALIZATION;  Surgeon: Fanny Skates, MD;  Location: [facility];  Service: General;  Laterality: Right;  . DENTAL SURGERY      Family History: No family history on file.  Social History:  reports that she has never smoked. She has never used smokeless tobacco. She reports current alcohol use of about 1.0 standard drinks of alcohol per week. She  reports that she does not use drugs.  Additional Social History:  Alcohol / Drug Use Pain Medications: see MAR Prescriptions: see MAR Over the Counter: see MAR History of alcohol / drug use?: Yes Longest period of sobriety (when/how long): N/A, patient states that she does not have problematic use of marijuana and states that she only drinks once monthly Substance #1 Name of Substance 1: alcohol 1 - Age of First Use: unknonw 1 - Amount (size/oz): 1-2 drinks 1 - Frequency: once monthly 1 - Duration: unknown 1 - Last Use / Amount: unknown  CIWA:    COWS:    Allergies: No Known Allergies  Home Medications:  No medications prior to admission.    OB/GYN Status:  No LMP recorded.  General Assessment Data Location of Assessment: [facility] Services TTS Assessment: In system Is this a Tele or Face-to-Face Assessment?: Face-to-Face Is this an Initial Assessment or a Re-assessment for this encounter?: Initial Assessment Patient Accompanied by:: Adult(brother) Permission Given to speak with another: Yes Name, Relationship and Phone Number: Jacqueline [name] number not available Language Other than English: No Living Arrangements: Other (Comment) What gender do you identify as?: (lives with her brother and her boyfriend) Marital status: Single Maiden name: Lantigua Pregnancy Status: No Living Arrangements: Spouse/significant other, Other (Comment)(and brother) Can pt return to current living arrangement?: Yes Admission Status: Voluntary Is patient capable of signing voluntary admission?: Yes Referral Source: Self/Family/Friend Insurance type: self-pay  Medical Screening Exam [facility] Walk-in ONLY) Medical Exam completed: Yes  Crisis Care Plan Living Arrangements: Spouse/significant other, Other (Comment)(and brother) Legal Guardian: Other:(self) Name of Psychiatrist: sees unknown psychiatrist Name of Therapist: none  Education Status Is patient currently in school?: No Is the patient employed, unemployed or receiving disability?: (has recently attended community college)  Risk to self with the past 6 months Suicidal Ideation: Yes-Currently Present Has patient been a risk to self within the past 6 months prior to admission? : Yes Suicidal Intent: No Has patient had any suicidal intent within the past 6 months prior to admission? : No Is patient at risk for suicide?: Yes Suicidal Plan?: No Has patient had any suicidal plan within the past 6 months prior to admission? : No Access to Means: No What has been your use of  drugs/alcohol within the last 12 months?: (uses CBD products) Previous Attempts/Gestures: No How many times?: 0 Other Self Harm Risks: none reported Triggers for Past Attempts: None known Intentional Self Injurious Behavior: None Family Suicide History: No Recent stressful life event(s): Other (Comment)(Pandemic) Persecutory voices/beliefs?: No Depression: Yes Depression Symptoms: Despondent, Insomnia, Isolating, Loss of interest in usual pleasures, Feeling worthless/self pity Substance abuse history and/or treatment for substance abuse?: No Suicide prevention information given to non-admitted patients: Not applicable  Risk to Others within the past 6 months Homicidal Ideation: No Does patient have any lifetime risk of violence toward others beyond the six months prior to admission? : No Thoughts of Harm to Others: No Current Homicidal Intent: No Current Homicidal Plan: No Access to Homicidal Means: No Identified Victim: none History of harm to others?: No Assessment of Violence: None Noted Violent Behavior Description: none Does patient have access to weapons?: No Criminal Charges Pending?: No Does patient have a court date: No Is patient on probation?: No  Psychosis Hallucinations: (pt denies, but appears to be responding to internal stimuli) Delusions: None noted  Mental Status Report Appearance/Hygiene: Unremarkable Eye Contact: Good Motor Activity: Unremarkable Speech: Logical/coherent Level of Consciousness: Alert Mood: Depressed, Anxious Affect: Depressed  Anxiety Level: Severe Thought Processes: Flight of Ideas, Thought Blocking Judgement: Impaired Orientation: Person, Place, Time, Situation Obsessive Compulsive Thoughts/Behaviors: None  Cognitive Functioning Concentration: Decreased Memory: Recent Intact, Remote Intact Is patient IDD: No Insight: Poor Impulse Control: Poor Appetite: Good Have you had any weight changes? : No Change Sleep:  Decreased Total Hours of Sleep: 5 Vegetative Symptoms: None  ADLScreening [facility] Assessment Services) Patient's cognitive ability adequate to safely complete daily activities?: Yes Patient able to express need for assistance with ADLs?: Yes Independently performs ADLs?: Yes (appropriate for developmental age)  Prior Inpatient Therapy Prior Inpatient Therapy: No  Prior Outpatient Therapy Prior Outpatient Therapy: Yes Prior Therapy Dates: active Prior Therapy Facilty/Provider(s): unknown psychiatrist Reason for Treatment: ADHD Does patient have an ACCT team?: No Does patient have Intensive In-House Services?  : No Does patient have [facility] services? : No Does patient have P4CC services?: No  ADL Screening (condition at time of admission) Patient's cognitive ability adequate to safely complete daily activities?: Yes Is the patient deaf or have difficulty hearing?: No Does the patient have difficulty seeing, even when wearing glasses/contacts?: No Does the patient have difficulty concentrating, remembering, or making decisions?: No Patient able to express need for assistance with ADLs?: Yes Does the patient have difficulty dressing or bathing?: No Independently performs ADLs?: Yes (appropriate for developmental age) Does the patient have difficulty walking or climbing stairs?: No Weakness of Legs: None Weakness of Arms/Hands: None  Home Assistive Devices/Equipment Home Assistive Devices/Equipment: None  Therapy Consults (therapy consults require a physician order) PT Evaluation Needed: No OT Evalulation Needed: No SLP Evaluation Needed: No Abuse/Neglect Assessment (Assessment to be complete while patient is alone) Abuse/Neglect Assessment Can Be Completed: Yes Physical Abuse: Denies Verbal Abuse: Denies Sexual Abuse: Denies Exploitation of patient/patient's resources: Denies Self-Neglect: Denies Values / Beliefs Cultural Requests During Hospitalization: None Spiritual  Requests During Hospitalization: None Consults Spiritual Care Consult Needed: No Social Work Consult Needed: No Regulatory affairs officer (For Healthcare) Does Patient Have a Medical Advance Directive?: No Would patient like information on creating a medical advance directive?: No - Patient declined Nutrition Screen- [facility] Adult/[facility]/AP Has the patient recently lost weight without trying?: No Has the patient been eating poorly because of a decreased appetite?: No Malnutrition Screening Tool Score: 0        Disposition: Per Jinny Blossom, NP, patient meets inpatient admission criteria Disposition Initial Assessment Completed for this Encounter: Yes Disposition of Patient: Admit Type of inpatient treatment program: Adult  On Site Evaluation by:   Reviewed with Physician:    Judeth Porch Apolo Cutshaw 03/23/2019 4:19 PM

## 2019-03-24 ENCOUNTER — Encounter (HOSPITAL_COMMUNITY): Payer: Self-pay

## 2019-03-24 ENCOUNTER — Other Ambulatory Visit: Payer: Self-pay

## 2019-03-24 DIAGNOSIS — F23 Brief psychotic disorder: Secondary | ICD-10-CM

## 2019-03-24 LAB — URINALYSIS, ROUTINE W REFLEX MICROSCOPIC
Bilirubin Urine: NEGATIVE
Glucose, UA: NEGATIVE mg/dL
Hgb urine dipstick: NEGATIVE
Ketones, ur: NEGATIVE mg/dL
Leukocytes,Ua: NEGATIVE
Nitrite: NEGATIVE
Protein, ur: NEGATIVE mg/dL
Specific Gravity, Urine: 1.01 (ref 1.005–1.030)
pH: 8 (ref 5.0–8.0)

## 2019-03-24 LAB — RAPID URINE DRUG SCREEN, HOSP PERFORMED
Amphetamines: NOT DETECTED
Barbiturates: NOT DETECTED
Benzodiazepines: NOT DETECTED
Cocaine: NOT DETECTED
Opiates: NOT DETECTED
Tetrahydrocannabinol: POSITIVE — AB

## 2019-03-24 LAB — PREGNANCY, URINE: Preg Test, Ur: NEGATIVE

## 2019-03-24 MED ORDER — PRENATAL MULTIVITAMIN CH
1.0000 | ORAL_TABLET | Freq: Every day | ORAL | Status: DC
  Administered 2019-03-25 – 2019-03-26 (×2): 1 via ORAL
  Filled 2019-03-24 (×4): qty 1

## 2019-03-24 MED ORDER — RISPERIDONE 2 MG PO TABS
2.0000 mg | ORAL_TABLET | Freq: Two times a day (BID) | ORAL | Status: DC
  Administered 2019-03-24 – 2019-03-27 (×6): 2 mg via ORAL
  Filled 2019-03-24 (×9): qty 1

## 2019-03-24 MED ORDER — TEMAZEPAM 15 MG PO CAPS
15.0000 mg | ORAL_CAPSULE | Freq: Every day | ORAL | Status: DC
  Administered 2019-03-25 – 2019-03-26 (×2): 15 mg via ORAL
  Filled 2019-03-24 (×3): qty 1

## 2019-03-24 MED ORDER — COMPLETENATE 29-1 MG PO CHEW: 1.0000 | CHEWABLE_TABLET | Freq: Every day | ORAL | Status: DC

## 2019-03-24 MED ORDER — OMEGA-3-ACID ETHYL ESTERS 1 G PO CAPS
1.0000 g | ORAL_CAPSULE | Freq: Two times a day (BID) | ORAL | Status: DC
  Administered 2019-03-24 – 2019-03-27 (×6): 1 g via ORAL
  Filled 2019-03-24: qty 14
  Filled 2019-03-24 (×9): qty 1
  Filled 2019-03-24: qty 14

## 2019-03-24 MED ORDER — BENZTROPINE MESYLATE 0.5 MG PO TABS
0.5000 mg | ORAL_TABLET | Freq: Two times a day (BID) | ORAL | Status: DC
  Administered 2019-03-24 – 2019-03-27 (×6): 0.5 mg via ORAL
  Filled 2019-03-24 (×2): qty 14
  Filled 2019-03-24 (×9): qty 1

## 2019-03-24 NOTE — H&P (Signed)
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  According to records and the patient's report, she was in school but would became very anxious during the [place] pandemic and became more paranoid she expressed to an examiner yesterday she felt the television was controlling her but she minimizes this today just stating she was upset about news further she elaborated that she could predict whether events and so she was expressing delusional material she acknowledges again the cannabis dependency. On my exam she is alert pleasant and oriented to person place time and situation just focuses on her anxiety she denies hearing and seeing things she denies wanting to harm herself.  However her brother's history indicates severe mood swings and "rage" and she has been so disruptive as to lose her job, walking away from the house without telling and wanting got in the car with a stranger and was gone for several days and she states she was just having "panic attacks" and went to the [facility] with her boyfriend for a little while but denies that she was missing 4 days.   She is also focused on the fact that when her father passed away she was not there with him and feels guilty about this.. During the evaluation of 8/6 she was described as having thought blocking and possible auditory hallucinations Associated Signs/Symptoms: Depression Symptoms:  panic attacks, (Hypo) Manic Symptoms:  Hallucinations, Anxiety Symptoms:  Excessive Worry, Panic Symptoms, Psychotic  Symptoms:  Hallucinations: Auditory PTSD Symptoms: NA Total Time spent with patient: 45 minutes  Past Psychiatric History: neg  Is the patient at risk to self? Yes.    Has the patient been a risk to self in the past 6 months? Yes.    Has the patient been a risk to self within the distant past? No.  Is the patient a risk to others? No.  Has the patient been a risk to others in the past 6 months? No.  Has the patient been a risk to others within the distant past? No.   Prior Inpatient Therapy: Prior Inpatient Therapy: No Prior Outpatient Therapy: Prior Outpatient Therapy: Yes Prior Therapy Dates: active Prior Therapy Facilty/Provider(s): unknown psychiatrist Reason for Treatment: ADHD Does patient have an ACCT team?: No Does patient have Intensive In-House Services?  : No Does patient have [facility] services? : No Does patient have P4CC services?: No  Alcohol Screening: 1. How often do you have a drink containing alcohol?: Never 2. How many drinks containing alcohol do you have on a typical day when you are drinking?: 1 or 2 3. How often do you have six or more drinks on one occasion?: Never AUDIT-C Score: 0 Alcohol Brief Interventions/Follow-up: AUDIT Score <7 follow-up not indicated Substance Abuse History in the last 12 months:  Yes.   Consequences of Substance Abuse: Medical Consequences:  Be inducing this illness Previous Psychotropic Medications: Yes  Psychological Evaluations: No  Past Medical History:  Past Medical History:  Diagnosis Date  . ADHD     Past Surgical History:  Procedure Laterality Date  . BREAST LUMPECTOMY WITH RADIOACTIVE SEED LOCALIZATION Right 10/12/2018   Procedure:  RIGHT BREAST LUMPECTOMY WITH RADIOACTIVE SEED LOCALIZATION;  Surgeon: Fanny Skates, MD;  Location: [facility];  Service: General;  Laterality: Right;  . DENTAL SURGERY     Family History: History reviewed. No pertinent family history. Family Psychiatric  History:  neg Tobacco Screening: Have you used any form of tobacco in the last 30 days? (Cigarettes, Smokeless Tobacco, Cigars, and/or Pipes): Yes Tobacco use, Select all that apply: 4 or less cigarettes per day Are you interested in Tobacco Cessation Medications?: No, patient refused Counseled patient on smoking cessation including recognizing danger situations, developing coping skills and basic information about quitting provided: Refused/Declined practical counseling Social History:  Social History   Substance and Sexual Activity  Alcohol Use Yes  . Alcohol/week: 1.0 standard drinks  . Types: 1 Glasses of wine per week   Comment: once a month     Social History   Substance and Sexual Activity  Drug Use No    Additional Social History: Marital status: Single    Pain Medications: see MAR Prescriptions: see MAR Over the Counter: see MAR History of alcohol / drug use?: Yes Longest period of sobriety (when/how long): N/A, patient states that she does not have problematic use of marijuana and states that she only drinks once monthly Name of Substance 1: alcohol 1 - Age of First Use: unknonw 1 - Amount (size/oz): 1-2 drinks 1 - Frequency: once monthly 1 - Duration: unknown 1 - Last Use / Amount: unknown                  Allergies:  No Known Allergies Lab Results:  Results for orders placed or performed during the hospital encounter of 03/23/19 (from the past 48 hour(s))  Urine rapid drug screen (hosp performed)not at [facility]     Status: Abnormal   Collection Time: 03/23/19  5:43 PM  Result Value Ref Range   Opiates NONE DETECTED NONE DETECTED   Cocaine NONE DETECTED NONE DETECTED   Benzodiazepines NONE DETECTED NONE DETECTED   Amphetamines NONE DETECTED NONE DETECTED   Tetrahydrocannabinol POSITIVE (A) NONE DETECTED   Barbiturates NONE DETECTED NONE DETECTED    Comment: (NOTE) DRUG SCREEN FOR MEDICAL PURPOSES ONLY.  IF CONFIRMATION IS NEEDED FOR ANY PURPOSE, NOTIFY  LAB WITHIN 5 DAYS. LOWEST DETECTABLE LIMITS FOR URINE DRUG SCREEN Drug Class                     Cutoff (ng/mL) Amphetamine and metabolites    1000 Barbiturate and metabolites    200 Benzodiazepine                 297 Tricyclics and metabolites     300 Opiates and metabolites        300 Cocaine and metabolites        300 THC                            50 Performed at [facility], [place] [address] [place], [place] [address]   Pregnancy, urine     Status: None   Collection Time: 03/23/19  5:43 PM  Result Value Ref Range   Preg Test, Ur NEGATIVE NEGATIVE    Comment:        THE SENSITIVITY OF THIS METHODOLOGY IS >20 mIU/mL. Performed at [facility], [place] [address], [place], [place] [address]   Urinalysis, Routine w reflex microscopic     Status: Abnormal  Collection Time: 03/23/19  5:43 PM  Result Value Ref Range   Color, Urine YELLOW YELLOW   APPearance CLOUDY (A) CLEAR   Specific Gravity, Urine 1.010 1.005 - 1.030   pH 8.0 5.0 - 8.0   Glucose, UA NEGATIVE NEGATIVE mg/dL   Hgb urine dipstick NEGATIVE NEGATIVE   Bilirubin Urine NEGATIVE NEGATIVE   Ketones, ur NEGATIVE NEGATIVE mg/dL   Protein, ur NEGATIVE NEGATIVE mg/dL   Nitrite NEGATIVE NEGATIVE   Leukocytes,Ua NEGATIVE NEGATIVE    Comment: Performed at [facility] [address] [place], [place] [address]  SARS Coronavirus 2 [facility] order, Performed in [facility] hospital lab) Nasopharyngeal Nasopharyngeal Swab     Status: None   Collection Time: 03/23/19  7:00 PM   Specimen: Nasopharyngeal Swab  Result Value Ref Range   SARS Coronavirus 2 NEGATIVE NEGATIVE    Comment: (NOTE) If result is NEGATIVE SARS-CoV-2 target nucleic acids are NOT DETECTED. The SARS-CoV-2 RNA is generally detectable in upper and lower  respiratory specimens during the acute phase of infection. The lowest  concentration of SARS-CoV-2 viral copies this assay can detect is 250  copies  / mL. A negative result does not preclude SARS-CoV-2 infection  and should not be used as the sole basis for treatment or other  patient management decisions.  A negative result may occur with  improper specimen collection / handling, submission of specimen other  than nasopharyngeal swab, presence of viral mutation(s) within the  areas targeted by this assay, and inadequate number of viral copies  (<250 copies / mL). A negative result must be combined with clinical  observations, patient history, and epidemiological information. If result is POSITIVE SARS-CoV-2 target nucleic acids are DETECTED. The SARS-CoV-2 RNA is generally detectable in upper and lower  respiratory specimens dur ing the acute phase of infection.  Positive  results are indicative of active infection with SARS-CoV-2.  Clinical  correlation with patient history and other diagnostic information is  necessary to determine patient infection status.  Positive results do  not rule out bacterial infection or co-infection with other viruses. If result is PRESUMPTIVE POSTIVE SARS-CoV-2 nucleic acids MAY BE PRESENT.   A presumptive positive result was obtained on the submitted specimen  and confirmed on repeat testing.  While 2019 novel coronavirus  (SARS-CoV-2) nucleic acids may be present in the submitted sample  additional confirmatory testing may be necessary for epidemiological  and / or clinical management purposes  to differentiate between  SARS-CoV-2 and other Sarbecovirus currently known to infect humans.  If clinically indicated additional testing with an alternate test  methodology [id number]) is advised. The SARS-CoV-2 RNA is generally  detectable in upper and lower respiratory sp ecimens during the acute  phase of infection. The expected result is Negative. Fact Sheet for Patients:  StrictlyIdeas.no Fact Sheet for Healthcare Providers: BankingDealers.co.za This test  is not yet approved or cleared by the [place] FDA and has been authorized for detection and/or diagnosis of SARS-CoV-2 by FDA under an Emergency Use Authorization (EUA).  This EUA will remain in effect (meaning this test can be used) for the duration of the COVID-19 declaration under Section 564(b)(1) of the Act, 21 U.S.C. section 360bbb-3(b)(1), unless the authorization is terminated or revoked sooner. Performed at [facility], [place] [address], [place], [place] [address]     Blood Alcohol level:  No results found for: [facility]  Metabolic Disorder Labs:  No results found for: HGBA1C, MPG No results found for: PROLACTIN  No results found for: CHOL, TRIG, HDL, CHOLHDL, VLDL, LDLCALC  Current Medications: Current Facility-Administered Medications  Medication Dose Route Frequency Provider Last Rate Last Dose  . acetaminophen (TYLENOL) tablet 650 mg  650 mg Oral Q6H PRN Ethelene Hal, NP      . alum & mag hydroxide-simeth (MAALOX/MYLANTA) 200-200-20 MG/5ML suspension 30 mL  30 mL Oral Q4H PRN Ethelene Hal, NP      . benztropine (COGENTIN) tablet 0.5 mg  0.5 mg Oral BID Johnn Hai, MD      . hydrOXYzine (ATARAX/VISTARIL) tablet 25 mg  25 mg Oral TID PRN Ethelene Hal, NP   25 mg at 03/23/19 2238  . magnesium hydroxide (MILK OF MAGNESIA) suspension 30 mL  30 mL Oral Daily PRN Ethelene Hal, NP      . omega-3 acid ethyl esters (LOVAZA) capsule 1 g  1 g Oral BID Johnn Hai, MD      . prenatal multivitamin tablet 1 tablet  1 tablet Oral Q1200 Johnn Hai, MD      . risperiDONE (RISPERDAL) tablet 2 mg  2 mg Oral BID Johnn Hai, MD      . temazepam (RESTORIL) capsule 15 mg  15 mg Oral QHS Johnn Hai, MD      . traZODone (DESYREL) tablet 50 mg  50 mg Oral QHS PRN Ethelene Hal, NP   50 mg at 03/23/19 2238   PTA Medications: Medications Prior to Admission  Medication Sig Dispense Refill Last Dose  .  amphetamine-dextroamphetamine (ADDERALL XR) 20 MG 24 hr capsule Take 20 mg by mouth 2 (two) times daily.     Mar[place] Kitchen HYDROcodone-acetaminophen (NORCO) 5-325 MG tablet Take 1-2 tablets by mouth every 6 (six) hours as needed for moderate pain or severe pain. 20 tablet 0     CLINICAL FACTORS:   Schizophrenia:   Less than 25 years old   Musculoskeletal: Strength & Muscle Tone: within normal limits Gait & Station: normal Patient leans: N/A  Psychiatric Specialty Exam: Physical Exam  Nursing note and vitals reviewed. Constitutional: She appears well-developed and well-nourished.  HENT:  Head: Normocephalic and atraumatic.    Review of Systems  Constitutional: Negative.   HENT: Negative.   Eyes: Negative.   Respiratory: Negative.   Gastrointestinal: Negative.   Genitourinary: Negative.   Musculoskeletal: Negative.   Skin: Negative.   Neurological: Negative.   Endo/Heme/Allergies: Negative.     Blood pressure 117/77, pulse (!) 114, temperature 98.8 F (37.1 C), temperature source Oral, resp. rate 16, height 5' 0.63" (1.54 m), weight 54 kg.Body mass index is 22.76 kg/m.  General Appearance: Fairly Groomed  Eye Contact:  Fair  Speech:  Clear and Coherent  Volume:  Normal  Mood:  Euthymic  Affect:  Congruent and Constricted  Thought Process:  Linear and Descriptions of Associations: Loose  Orientation:  Full (Time, Place, and Person)  Thought Content:  Logical, Delusions and Hallucinations: Auditory  Suicidal Thoughts:  No  Homicidal Thoughts:  No  Memory:  Immediate;   Fair  Judgement:  Fair  Insight:  Fair  Psychomotor Activity:  Normal  Concentration:  Concentration: Fair  Recall:  [employer] of Knowledge:  Fair  Language:  nl  Akathisia:  Negative  Handed:  Right  AIMS (if indicated):     Assets:  Communication Skills Desire for Improvement  ADL's:  Intact  Cognition:  WNL  Sleep:  Number of Hours: 6.25    Treatment Plan Summary: Daily contact with patient to  assess and evaluate symptoms and progress in treatment and Medication management  Observation Level/Precautions:  15 minute checks  Laboratory:  UDS  Psychotherapy: Cognitive and reality based  Medications: Begin neuro protection and Risperdal therapy  Consultations: None necessary  Discharge Concerns: Stability and diagnostic clarity  Estimated LOS: 7-10  Other: Axis I schizophreniform disorder/cannabis dependence Axis II deferred Axis III medically stable   Physician Treatment Plan for Primary Diagnosis: Brief psychotic disorder ([place]) Long Term Goal(s): Improvement in symptoms so as ready for discharge  Short Term Goals: Ability to verbalize feelings will improve, Ability to disclose and discuss suicidal ideas and Ability to identify and develop effective coping behaviors will improve  Physician Treatment Plan for Secondary Diagnosis: Principal Problem:   Brief psychotic disorder ([place])  Long Term Goal(s): Improvement in symptoms so as ready for discharge  Short Term Goals: Ability to demonstrate self-control will improve, Ability to identify and develop effective coping behaviors will improve and Compliance with prescribed medications will improve  I certify that inpatient services furnished can reasonably be expected to improve the patient's condition.    Johnn Hai, MD 8/7/202012:50 PM

## 2019-03-24 NOTE — Progress Notes (Signed)
Patient ID: Jacqueline [name], female   DOB: 1996/02/11, 23 y.o.   MRN: [id number] Admission note: D:Patient is a  Voluntary admission in no acute distress for depression and panic attacks. Since COVID she has gotten very paranoid and has been having panic attacks and picking on her skin. Pt has a dime size blister on her rt pointing finger. Pt reports she had to stop watching television because it was controlling her. Pt reports hx of ADHD and it was very difficult to follow her conversation. Pt moved from one topic to another. Pt reports she takes Wellbutrin at home. Pt reports having suicidal thoughts but no self harm.  A: Pt admitted to unit per protocol, skin assessment and belonging search done.  Consent signed by pt. Pt educated on therapeutic milieu rules. Pt was introduced to milieu by nursing staff. Fall risk / suicide safety plan explained to the patient. 15 minutes checks started for safety. R: Pt was receptive to education. Writer offered support.

## 2019-03-24 NOTE — Tx Team (Signed)
Initial Treatment Plan 03/24/2019 1:38 AM Simrat Rosendo Gros RKY:[id number]    PATIENT STRESSORS: Educational concerns Financial difficulties   PATIENT STRENGTHS: Average or above average intelligence Capable of independent living Motivation for treatment/growth Supportive family/friends   PATIENT IDENTIFIED PROBLEMS:   "I want to sleep"  "I want to stop procrastinating"  depression  anxiety  Panic attack  Suicide ideations         DISCHARGE CRITERIA:  Ability to meet basic life and health needs Improved stabilization in mood, thinking, and/or behavior Motivation to continue treatment in a less acute level of care Need for constant or close observation no longer present  PRELIMINARY DISCHARGE PLAN: Outpatient therapy Return to previous living arrangement Return to previous work or school arrangements  PATIENT/FAMILY INVOLVEMENT: This treatment plan has been presented to and reviewed with the patient, Geovana Gebel,  The patient and family have been given the opportunity to ask questions and make suggestions.  JEHU-APPIAH, Megan Salon, RN 03/24/2019, 1:38 AM

## 2019-03-24 NOTE — Progress Notes (Signed)
Recreation Therapy Notes  Date: 03/24/2019 Time: 10:00 am Location: [address]   Group Topic: Anger Thermometer  Goal Area(s) Addresses:  Patient will work on Academic librarian. Patient will follow directions on first prompt.  Behavioral Response: Appropriate  Intervention: Worksheet  Activity:  Staff on [address] hall were provided with a worksheet on Anger Thermometer. Staff was instructed to give it to the patients and have them work on it in place of [place]. Staff was also given 4 coloring sheets and were given the option to give them out.  Education:  Ability to follow Directions, Change of thought processes Discharge Planning, Goal Planning.   Education Outcome: Acknowledges education/In group clarification offered  Clinical Observations/Feedback: . Due to COVID-19, guidelines group was not held. Group members were provided a learning activity worksheet to work on the topic and above-stated goals. LRT is available to answer any questions patient may have regarding the worksheet.  Tomi Likens, LRT/CTRS         Kinsey Cowsert L Kimberlyann Hollar 03/24/2019 10:12 AM
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
## 2019-03-24 NOTE — Tx Team (Signed)
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 acetaminophen (TYLENOL) tablet 650 mg  650 mg Oral Q6H PRN Ethelene Hal, NP      . alum & mag hydroxide-simeth (MAALOX/MYLANTA) 200-200-20 MG/5ML suspension 30 mL  30 mL Oral Q4H PRN Ethelene Hal, NP      . benztropine (COGENTIN) tablet 0.5 mg  0.5 mg Oral BID Johnn Hai, MD      . hydrOXYzine (ATARAX/VISTARIL) tablet 25 mg  25 mg Oral TID PRN Ethelene Hal, NP   25 mg at 03/23/19 2238  . magnesium hydroxide (MILK OF MAGNESIA) suspension 30 mL  30 mL Oral Daily PRN Ethelene Hal, NP      . omega-3 acid ethyl esters (LOVAZA) capsule 1 g  1 g Oral BID Johnn Hai, MD      . prenatal multivitamin tablet 1 tablet  1 tablet Oral Q1200 Johnn Hai, MD      . risperiDONE (RISPERDAL) tablet 2 mg  2 mg Oral BID Johnn Hai, MD      . temazepam (RESTORIL) capsule 15 mg  15 mg Oral QHS Johnn Hai, MD      . traZODone (DESYREL) tablet 50 mg  50 mg Oral QHS PRN Ethelene Hal, NP   50 mg at 03/23/19 2238   PTA Medications: Medications Prior to Admission  Medication Sig Dispense Refill Last Dose  . amphetamine-dextroamphetamine (ADDERALL XR) 20 MG 24 hr capsule Take 20 mg by mouth 2 (two) times daily.     Marland Kitchen HYDROcodone-acetaminophen (NORCO) 5-325 MG tablet Take 1-2 tablets by mouth every 6 (six) hours as needed for moderate pain or severe pain. 20 tablet 0     Patient Stressors: Educational concerns Financial difficulties  Patient Strengths: Average or above average intelligence Capable of independent living Motivation for treatment/growth Supportive  family/friends  Treatment Modalities: Medication Management, Group therapy, Case management,  1 to 1 session with clinician, Psychoeducation, Recreational therapy.   Physician Treatment Plan for Primary Diagnosis: Brief psychotic disorder ([place]) Long Term Goal(s):     Short Term Goals:    Medication Management: Evaluate patient's response, side effects, and tolerance of medication regimen.  Therapeutic Interventions: 1 to 1 sessions, Unit Group sessions and Medication administration.  Evaluation of Outcomes: Not Met  Physician Treatment Plan for Secondary Diagnosis: Principal Problem:   Brief psychotic disorder ([place])  Long Term Goal(s):     Short Term Goals:       Medication Management: Evaluate patient's response, side effects, and tolerance of medication regimen.  Therapeutic Interventions: 1 to 1 sessions, Unit Group sessions and Medication administration.  Evaluation of Outcomes: Not Met   RN Treatment Plan for Primary Diagnosis: Brief psychotic disorder ([place]) Long Term Goal(s): Knowledge of disease and therapeutic regimen to maintain health will improve  Short Term Goals: Ability to participate in decision making will improve, Ability to verbalize feelings will improve, Ability to disclose and discuss suicidal ideas, Ability to identify and develop effective coping behaviors will improve and Compliance with prescribed medications will improve  Medication Management: RN will administer medications as ordered by provider, will assess and evaluate patient's response and provide education to patient for prescribed medication. RN will report any adverse and/or side effects  to prescribing provider.  Therapeutic Interventions: 1 on 1 counseling sessions, Psychoeducation, Medication administration, Evaluate responses to treatment, Monitor vital signs and CBGs as ordered, Perform/monitor CIWA, COWS, AIMS and Fall Risk screenings as ordered, Perform wound care treatments as  ordered.  Evaluation of Outcomes: Not Met   LCSW Treatment Plan for Primary Diagnosis: Brief psychotic disorder [facility]) Long Term Goal(s): Safe transition to appropriate next level of care at discharge, Engage patient in therapeutic group addressing interpersonal concerns.  Short Term Goals: Engage patient in aftercare planning with referrals and resources and Increase skills for wellness and recovery  Therapeutic Interventions: Assess for all discharge needs, 1 to 1 time with Social worker, Explore available resources and support systems, Assess for adequacy in community support network, Educate family and significant other(s) on suicide prevention, Complete Psychosocial Assessment, Interpersonal group therapy.  Evaluation of Outcomes: Not Met   Progress in Treatment: Attending groups: No. Participating in groups: No. Taking medication as prescribed: As evidenced by:  Medications have not been assigned, yet Toleration medication: As evidenced by:  Medications have not been assigned, yet Family/Significant other contact made: No, will contact:  pt's mom or brother Patient understands diagnosis: No. Discussing patient identified problems/goals with staff: Yes. Medical problems stabilized or resolved: Yes. Denies suicidal/homicidal ideation: Yes. Issues/concerns per patient self-inventory: No. Other:   New problem(s) identified: No, Describe:  None  New Short Term/Long Term Goal(s): Medication stabilization, elimination of SI thoughts, and development of a comprehensive mental wellness plan.   Patient Goals:    Discharge Plan or Barriers: CSW will continue to follow up for appropriate referrals and possible discharge planning  Reason for Continuation of Hospitalization: Depression Hallucinations Medication stabilization  Estimated Length of Stay: 3-5 days  Attendees: Patient: 03/24/2019   Physician: Dr. Johnn Hai, MD 03/24/2019   Nursing: Vladimir Faster, RN 03/24/2019   RN Care Manager:  03/24/2019   Social Worker: Ardelle Anton, LCSW 03/24/2019   Recreational Therapist:  03/24/2019  Other:  03/24/2019   Other:  03/24/2019   Other: 03/24/2019      Scribe for Treatment Team: Trecia Rogers, LCSW 03/24/2019 10:15 AM

## 2019-03-25 NOTE — Progress Notes (Signed)
   03/25/19 1101  COVID-19 Daily Checkoff  Have you had a fever (temp > 37.80C/100F)  in the past 24 hours?  No  If you have had runny nose, nasal congestion, sneezing in the past 24 hours, has it worsened? No  COVID-19 EXPOSURE  Have you traveled outside the state in the past 14 days? No  Have you been in contact with someone with a confirmed diagnosis of COVID-19 or PUI in the past 14 days without wearing appropriate PPE? No  Have you been living in the same home as a person with confirmed diagnosis of COVID-19 or a PUI (household contact)? No  Have you been diagnosed with COVID-19? No
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
## 2019-03-25 NOTE — Progress Notes (Signed)
Adult Psychoeducational Group Note  Date:  03/25/2019 Time:  1:05 AM  Group Topic/Focus:  Wrap-Up Group:   The focus of this group is to help patients review their daily goal of treatment and discuss progress on daily workbooks.  Participation Level:  Did Not Attend  Participation Quality:  Did not attend  Affect:  Did not attend  Cognitive:  Did not attend  Insight: None  Engagement in Group:  Did not attend  Modes of Intervention:  Did not attend  Additional Comments:  Pt did not attend evening wrap up group tonight.  Candy Sledge 03/25/2019, 1:05 AM

## 2019-03-25 NOTE — Progress Notes (Signed)
[facility] MD Progress Note  03/25/2019 11:26 AM Jacqueline [name]  MRN:  [id number] Subjective: Patient reports "I feel tired".  Denies hallucinations at this time.  Denies suicidal ideations. Objective: I have reviewed chart notes and have met with patient. 23 year old female, presented for paranoid ideations, had described that she felt the television was controlling her.  Collateral information from brother indicated that she was having mood swings and impulsive behaviors such as leaving with a stranger/missing for several days. Currently patient states she is feeling "all right".  At this time denies depression and states "my mood is okay".  She denies suicidal ideations.  Denies hallucinations and does not currently present internally preoccupied.  She is somewhat vague in describing events that led to admission but does state that she was having anxiety, sleeping poorly, and having panic attacks before admission and acknowledges that the [place] epidemic and subsequent restrictions have been difficult to navigate.  She also describes some unresolved grief/feelings of guilt related to her father passing away. As per staff patient has presented pleasant on approach, somatically focused.  No disruptive or agitated behaviors on unit. Denies medication side effects (currently on risperidone, cogentin, temazepam) Principal Problem: Brief psychotic disorder ([place]) Diagnosis: Principal Problem:   Brief psychotic disorder ([place])  Total Time spent with patient: 20 minutes  Past Psychiatric History:   Past Medical History:  Past Medical History:  Diagnosis Date  . ADHD     Past Surgical History:  Procedure Laterality Date  . BREAST LUMPECTOMY WITH RADIOACTIVE SEED LOCALIZATION Right 10/12/2018   Procedure: RIGHT BREAST LUMPECTOMY WITH RADIOACTIVE SEED LOCALIZATION;  Surgeon: Fanny Skates, MD;  Location: [place];  Service: General;  Laterality: Right;  . DENTAL SURGERY     Family  History: History reviewed. No pertinent family history. Family Psychiatric  History:  Social History:  Social History   Substance and Sexual Activity  Alcohol Use Yes  . Alcohol/week: 1.0 standard drinks  . Types: 1 Glasses of wine per week   Comment: once a month     Social History   Substance and Sexual Activity  Drug Use No    Social History   Socioeconomic History  . Marital status: Single    Spouse name: Not on file  . Number of children: 0  . Years of education: Not on file  . Highest education level: Some college, no degree  Occupational History  . Not on file  Social Needs  . Financial resource strain: Not on file  . Food insecurity    Worry: Not on file    Inability: Not on file  . Transportation needs    Medical: No    Non-medical: No  Tobacco Use  . Smoking status: Never Smoker  . Smokeless tobacco: Never Used  Substance and Sexual Activity  . Alcohol use: Yes    Alcohol/week: 1.0 standard drinks    Types: 1 Glasses of wine per week    Comment: once a month  . Drug use: No  . Sexual activity: Yes    Birth control/protection: None  Lifestyle  . Physical activity    Days per week: 2 days    Minutes per session: 30 min  . Stress: To some extent  Relationships  . Social connections    Talks on phone: More than three times a week    Gets together: Once a week    Attends religious service: Never    Active member of club or organization: No  Attends meetings of clubs or organizations: Never    Relationship status: Living with partner  Other Topics Concern  . Not on file  Social History Narrative  . Not on file   Additional Social History:    Pain Medications: see MAR Prescriptions: see MAR Over the Counter: see MAR History of alcohol / drug use?: Yes Longest period of sobriety (when/how long): N/A, patient states that she does not have problematic use of marijuana and states that she only drinks once monthly Name of Substance 1: alcohol 1 -  Age of First Use: unknonw 1 - Amount (size/oz): 1-2 drinks 1 - Frequency: once monthly 1 - Duration: unknown 1 - Last Use / Amount: unknown  Sleep: Improving  Appetite:  Fair  Current Medications: Current Facility-Administered Medications  Medication Dose Route Frequency Provider Last Rate Last Dose  . acetaminophen (TYLENOL) tablet 650 mg  650 mg Oral Q6H PRN Ethelene Hal, NP      . alum & mag hydroxide-simeth (MAALOX/MYLANTA) 200-200-20 MG/5ML suspension 30 mL  30 mL Oral Q4H PRN Ethelene Hal, NP      . benztropine (COGENTIN) tablet 0.5 mg  0.5 mg Oral BID Johnn Hai, MD   0.5 mg at 03/25/19 0804  . hydrOXYzine (ATARAX/VISTARIL) tablet 25 mg  25 mg Oral TID PRN Ethelene Hal, NP   25 mg at 03/23/19 2238  . magnesium hydroxide (MILK OF MAGNESIA) suspension 30 mL  30 mL Oral Daily PRN Ethelene Hal, NP      . omega-3 acid ethyl esters (LOVAZA) capsule 1 g  1 g Oral BID Johnn Hai, MD   1 g at 03/25/19 0804  . prenatal multivitamin tablet 1 tablet  1 tablet Oral Q1200 Johnn Hai, MD      . risperiDONE (RISPERDAL) tablet 2 mg  2 mg Oral BID Johnn Hai, MD   2 mg at 03/25/19 0804  . temazepam (RESTORIL) capsule 15 mg  15 mg Oral QHS Johnn Hai, MD      . traZODone (DESYREL) tablet 50 mg  50 mg Oral QHS PRN Ethelene Hal, NP   50 mg at 03/23/19 2238    Lab Results:  Results for orders placed or performed during the hospital encounter of 03/23/19 (from the past 48 hour(s))  Urine rapid drug screen (hosp performed)not at [facility]     Status: Abnormal   Collection Time: 03/23/19  5:43 PM  Result Value Ref Range   Opiates NONE DETECTED NONE DETECTED   Cocaine NONE DETECTED NONE DETECTED   Benzodiazepines NONE DETECTED NONE DETECTED   Amphetamines NONE DETECTED NONE DETECTED   Tetrahydrocannabinol POSITIVE (A) NONE DETECTED   Barbiturates NONE DETECTED NONE DETECTED    Comment: (NOTE) DRUG SCREEN FOR MEDICAL PURPOSES ONLY.  IF CONFIRMATION  IS NEEDED FOR ANY PURPOSE, NOTIFY LAB WITHIN 5 DAYS. LOWEST DETECTABLE LIMITS FOR URINE DRUG SCREEN Drug Class                     Cutoff (ng/mL) Amphetamine and metabolites    1000 Barbiturate and metabolites    200 Benzodiazepine                 161 Tricyclics and metabolites     300 Opiates and metabolites        300 Cocaine and metabolites        300 THC  50 Performed at [facility], [place] [address], [place], [place] [address]   Pregnancy, urine     Status: None   Collection Time: 03/23/19  5:43 PM  Result Value Ref Range   Preg Test, Ur NEGATIVE NEGATIVE    Comment:        THE SENSITIVITY OF THIS METHODOLOGY IS >20 mIU/mL. Performed at [facility], [place] [address], [place], [place] [address]   Urinalysis, Routine w reflex microscopic     Status: Abnormal   Collection Time: 03/23/19  5:43 PM  Result Value Ref Range   Color, Urine YELLOW YELLOW   APPearance CLOUDY (A) CLEAR   Specific Gravity, Urine 1.010 1.005 - 1.030   pH 8.0 5.0 - 8.0   Glucose, UA NEGATIVE NEGATIVE mg/dL   Hgb urine dipstick NEGATIVE NEGATIVE   Bilirubin Urine NEGATIVE NEGATIVE   Ketones, ur NEGATIVE NEGATIVE mg/dL   Protein, ur NEGATIVE NEGATIVE mg/dL   Nitrite NEGATIVE NEGATIVE   Leukocytes,Ua NEGATIVE NEGATIVE    Comment: Performed at [facility] [address], [place], [place] [address]  SARS Coronavirus 2 [facility] order, Performed in [facility] hospital lab) Nasopharyngeal Nasopharyngeal Swab     Status: None   Collection Time: 03/23/19  7:00 PM   Specimen: Nasopharyngeal Swab  Result Value Ref Range   SARS Coronavirus 2 NEGATIVE NEGATIVE    Comment: (NOTE) If result is NEGATIVE SARS-CoV-2 target nucleic acids are NOT DETECTED. The SARS-CoV-2 RNA is generally detectable in upper and lower  respiratory specimens during the acute phase of infection. The lowest  concentration of SARS-CoV-2 viral copies  this assay can detect is 250  copies / mL. A negative result does not preclude SARS-CoV-2 infection  and should not be used as the sole basis for treatment or other  patient management decisions.  A negative result may occur with  improper specimen collection / handling, submission of specimen other  than nasopharyngeal swab, presence of viral mutation(s) within the  areas targeted by this assay, and inadequate number of viral copies  (<250 copies / mL). A negative result must be combined with clinical  observations, patient history, and epidemiological information. If result is POSITIVE SARS-CoV-2 target nucleic acids are DETECTED. The SARS-CoV-2 RNA is generally detectable in upper and lower  respiratory specimens dur ing the acute phase of infection.  Positive  results are indicative of active infection with SARS-CoV-2.  Clinical  correlation with patient history and other diagnostic information is  necessary to determine patient infection status.  Positive results do  not rule out bacterial infection or co-infection with other viruses. If result is PRESUMPTIVE POSTIVE SARS-CoV-2 nucleic acids MAY BE PRESENT.   A presumptive positive result was obtained on the submitted specimen  and confirmed on repeat testing.  While 2019 novel coronavirus  (SARS-CoV-2) nucleic acids may be present in the submitted sample  additional confirmatory testing may be necessary for epidemiological  and / or clinical management purposes  to differentiate between  SARS-CoV-2 and other Sarbecovirus currently known to infect humans.  If clinically indicated additional testing with an alternate test  methodology [phone]) is advised. The SARS-CoV-2 RNA is generally  detectable in upper and lower respiratory sp ecimens during the acute  phase of infection. The expected result is Negative. Fact Sheet for Patients:  StrictlyIdeas.no Fact Sheet for Healthcare  Providers: BankingDealers.co.za This test is not yet approved or cleared by the [place] FDA and has been authorized for detection and/or  diagnosis of SARS-CoV-2 by FDA under an Emergency Use Authorization (EUA).  This EUA will remain in effect (meaning this test can be used) for the duration of the COVID-19 declaration under Section 564(b)(1) of the Act, 21 U.S.C. section 360bbb-3(b)(1), unless the authorization is terminated or revoked sooner. Performed at [facility], [place] [address], [place], [place] [address]     Blood Alcohol level:  No results found for: [facility]  Metabolic Disorder Labs: No results found for: HGBA1C, MPG No results found for: PROLACTIN No results found for: CHOL, TRIG, HDL, CHOLHDL, VLDL, LDLCALC  Physical Findings: AIMS: Facial and Oral Movements Muscles of Facial Expression: None, normal Lips and Perioral Area: None, normal Jaw: None, normal Tongue: None, normal,Extremity Movements Upper (arms, wrists, hands, fingers): None, normal Lower (legs, knees, ankles, toes): None, normal, Trunk Movements Neck, shoulders, hips: None, normal, Overall Severity Severity of abnormal movements (highest score from questions above): None, normal Incapacitation due to abnormal movements: None, normal Patient's awareness of abnormal movements (rate only patient's report): No Awareness, Dental Status Current problems with teeth and/or dentures?: No Does patient usually wear dentures?: No  CIWA:    COWS:     Musculoskeletal: Strength & Muscle Tone: within normal limits Gait & Station: normal Patient leans: N/A  Psychiatric Specialty Exam: Physical Exam  ROS denies chest pain or shortness of breath, no cough, no fever, no chills  Blood pressure 103/70, pulse (!) 128, temperature 98.7 F (37.1 C), temperature source Oral, resp. rate 16, height 5' 0.63" (1.54 m), weight 54 kg.Body mass index is 22.76 kg/m.  General  Appearance: Well Groomed  Eye Contact:  Good  Speech:  Normal Rate  Volume:  Normal  Mood:  Reports mood as "okay" at this time denies depression  Affect:  Appropriate  Thought Process:  Linear and Descriptions of Associations: Intact  Orientation:  Other:  Currently presents alert and attentive  Thought Content:  At this time denies hallucinations and no delusions are expressed at present  Suicidal Thoughts:  No denies suicidal or self-injurious ideations, contracts for safety on unit, denies homicidal ideations  Homicidal Thoughts:  No  Memory:  Recent and remote grossly intact  Judgement:  Other:  Fair/improving  Insight:  Fair  Psychomotor Activity:  Normal no current psychomotor agitation or restlessness  Concentration:  Concentration: Good and Attention Span: Good  Recall:  Good  Fund of Knowledge:  Good  Language:  Good  Akathisia:  Negative  Handed:  Right  AIMS (if indicated):     Assets:  Desire for Improvement Resilience  ADL's:  Intact  Cognition:  WNL  Sleep:  Number of Hours: 6.75   Assessment - 23 year old female, presented for paranoid ideations, had described that she felt the television was controlling her.  Collateral information from brother indicated that she was having mood swings and impulsive behaviors such as leaving with a stranger/missing for several days.  Today patient presents alert, attentive, calm, behavior in good control, not presenting with or endorsing any overt psychotic symptoms.  Tolerating Risperdal trial well thus far.  Mood present stable and denies suicidal ideations at this time  Treatment Plan Summary: Daily contact with patient to assess and evaluate symptoms and progress in treatment, Medication management, Plan Inpatient treatment and Medication as below Encourage group and milieu participation Continue Risperdal 2 mg twice daily for psychosis Continue Restoril 15 mg nightly for insomnia Continue trazodone 50 mg nightly PRN for  insomnia Continue Vistaril 25 mg 3 times daily PRN  for anxiety Continue Cogentin 0.5 mg twice daily to prevent antipsychotic-induced side effects Will check routine labs-CBC, BMP, UA, TSH, ( as well as  hemoglobin A1c, lipid panel as on antipsychotic management)  Treatment team working on disposition planning options Jenne Campus, MD 03/25/2019, 11:26 AM

## 2019-03-25 NOTE — Progress Notes (Signed)
D: Pt denies SI/HI/AVH. Pt kept to herself this evening.  A: Pt was offered support and encouragement.  Pt was encourage to attend groups. Q 15 minute checks were done for safety.  R: safety maintained on unit.

## 2019-03-25 NOTE — Progress Notes (Signed)
D: Patient presents anxious and somatic. She c/o constipation and feeling uncomfortable, but refuses laxatives. She also c/o of lightheadedness, and has elevated HR. She does not appear to be notably orthostatic. Patient denies SI/HI/AVH. Per self-inventory: Patient slept well last night and received medication that was helpful. Her appetite is poor, energy low and concentration poor. She rates his depression 1/10 and anxiety 5/10. She c/o chills, cramps, and irritability. Stomach pain 7/10. Goals: "Helping others focusing on positive thoughts, most important" and "Think positive and organizing" and "What can I do to be a better person?" A: Patient checked q15 min, and checks reviewed. Reviewed medication changes with patient and educated on side effects. Educated patient on importance of attending group therapy sessions and educated on several coping skills. Encouarged participation in milieu through recreation therapy and attending meals with peers. Support and encouragement provided. Fluids offered. R: Patient receptive to education on medications, and is medication compliant. Patient contracts for safety on the unit.

## 2019-03-25 NOTE — Progress Notes (Signed)
D: Pt denies SI/HI/AVH. Pt is pleasant and cooperative. Pt stated she was doing better due to getting some rest. Pt visible on the milieu this evening.  A: Pt was offered support and encouragement. Pt was given scheduled medications. Pt was encourage to attend groups. Q 15 minute checks were done for safety.  R:Pt attends groups and interacts well with peers and staff. Pt is taking medication. Pt has no complaints.Pt receptive to treatment and safety maintained on unit.

## 2019-03-25 NOTE — Progress Notes (Signed)
Adult Psychoeducational Group Note  Date:  03/25/2019 Time:  10:49 PM  Group Topic/Focus:  Wrap-Up Group:   The focus of this group is to help patients review their daily goal of treatment and discuss progress on daily workbooks.  Participation Level:  Minimal  Participation Quality:  Appropriate  Affect:  Appropriate  Cognitive:  Appropriate  Insight: Limited  Engagement in Group:  Developing/Improving  Modes of Intervention:  Discussion  Additional Comments:  Pt stated her goal for today was to get some rest. Pt stated she felt she accomplished her goal today. Pt stated her relationship with her family has improve since coming here. Pt stated she felt better about herself today. Pt rated her over all day an 9 out of 10. Pt stated her appetite was Improving  today. Pt stated her goal for tonight was to get another good night's rest. Pt complained about no pain tonight. Pt stated that she would alert staff if anything changed.  Candy Sledge 03/25/2019, 10:49 PM

## 2019-03-26 LAB — CBC WITH DIFFERENTIAL/PLATELET
Abs Immature Granulocytes: 0 10*3/uL (ref 0.00–0.07)
Basophils Absolute: 0 10*3/uL (ref 0.0–0.1)
Basophils Relative: 1 %
Eosinophils Absolute: 0.2 10*3/uL (ref 0.0–0.5)
Eosinophils Relative: 3 %
HCT: 36.1 % (ref 36.0–46.0)
Hemoglobin: 11.5 g/dL — ABNORMAL LOW (ref 12.0–15.0)
Immature Granulocytes: 0 %
Lymphocytes Relative: 47 %
Lymphs Abs: 2.7 10*3/uL (ref 0.7–4.0)
MCH: 26.5 pg (ref 26.0–34.0)
MCHC: 31.9 g/dL (ref 30.0–36.0)
MCV: 83.2 fL (ref 80.0–100.0)
Monocytes Absolute: 0.5 10*3/uL (ref 0.1–1.0)
Monocytes Relative: 9 %
Neutro Abs: 2.3 10*3/uL (ref 1.7–7.7)
Neutrophils Relative %: 40 %
Platelets: 217 10*3/uL (ref 150–400)
RBC: 4.34 MIL/uL (ref 3.87–5.11)
RDW: 15.4 % (ref 11.5–15.5)
WBC: 5.8 10*3/uL (ref 4.0–10.5)
nRBC: 0 % (ref 0.0–0.2)

## 2019-03-26 LAB — URINALYSIS, COMPLETE (UACMP) WITH MICROSCOPIC
Bilirubin Urine: NEGATIVE
Glucose, UA: NEGATIVE mg/dL
Hgb urine dipstick: NEGATIVE
Ketones, ur: NEGATIVE mg/dL
Nitrite: NEGATIVE
Protein, ur: NEGATIVE mg/dL
Specific Gravity, Urine: 1.003 — ABNORMAL LOW (ref 1.005–1.030)
pH: 6 (ref 5.0–8.0)

## 2019-03-26 LAB — LIPID PANEL
Cholesterol: 160 mg/dL (ref 0–200)
HDL: 67 mg/dL (ref >40)
LDL Cholesterol: 84 mg/dL (ref 0–99)
Total CHOL/HDL Ratio: 2.4 RATIO
Triglycerides: 43 mg/dL (ref <150)
VLDL: 9 mg/dL (ref 0–40)

## 2019-03-26 LAB — BASIC METABOLIC PANEL
Anion gap: 10 (ref 5–15)
BUN: 8 mg/dL (ref 6–20)
CO2: 23 mmol/L (ref 22–32)
Calcium: 8.7 mg/dL — ABNORMAL LOW (ref 8.9–10.3)
Chloride: 106 mmol/L (ref 98–111)
Creatinine, Ser: 0.56 mg/dL (ref 0.44–1.00)
GFR calc Af Amer: >60 mL/min (ref >60)
GFR calc non Af Amer: >60 mL/min (ref >60)
Glucose, Bld: 96 mg/dL (ref 70–99)
Potassium: 3.6 mmol/L (ref 3.5–5.1)
Sodium: 139 mmol/L (ref 135–145)

## 2019-03-26 LAB — HEMOGLOBIN A1C
Hgb A1c MFr Bld: 5.4 % (ref 4.8–5.6)
Mean Plasma Glucose: 108.28 mg/dL

## 2019-03-26 LAB — TSH: TSH: 1.907 u[IU]/mL (ref 0.350–4.500)

## 2019-03-26 NOTE — Progress Notes (Signed)
Adult Psychoeducational Group Note  Date:  03/26/2019 Time:  11:56 PM  Group Topic/Focus:  Wrap-Up Group:   The focus of this group is to help patients review their daily goal of treatment and discuss progress on daily workbooks.  Participation Level:  Active  Participation Quality:  Appropriate  Affect:  Appropriate  Cognitive:  Appropriate  Insight: Appropriate  Engagement in Group:  Developing/Improving  Modes of Intervention:  Discussion  Additional Comments:Pt stated her goal for today was to focus on her treatment plan and to get some rest. Pt stated she felt she accomplished her goal today. Pt stated her relationship with her family has improved since she was admitted here. Pt stated she felt better about herself today. Pt rated her overall day a 10. Pt stated her appetite was pretty good today. Pt stated her goal for tonight is to get some rest. Pt did not complain of any pain tonight. Pt stated she would alert staff if anything changes.     Candy Sledge 03/26/2019, 11:56 PM

## 2019-03-26 NOTE — Progress Notes (Signed)
[facility] MD Progress Note  03/26/2019 10:49 AM Jacqueline [name]  MRN:  [id number] Subjective: Patient reports she is feeling "a lot better".  She states she slept well last night.  She denies medication side effects.  She denies suicidal ideations, other than slight feeling of sedation (presents fully alert and attentive) Objective: I have reviewed chart notes and have met with patient. 23 year old female, presented for paranoid ideations, had described that she felt the television was controlling her.  Collateral information from brother indicated that she was having mood swings and impulsive behaviors such as leaving with a stranger/missing for several days.  At present patient is alert, attentive, well-groomed, pleasant on approach behavior calm and in good control.  She denies hallucinations and does not express delusions at this time.  She specifically states she no longer feels "controlled" and she is been able to watch TV in the day room without any feelings that it is somehow controlling her or any anxiety. Behavior on unit in good control, visible in dayroom. Denies medication side effects (currently on risperidone, cogentin, temazepam)-does report a slight feeling of sedation but states that this is better today and currently presents fully alert and attentive Labs reviewed-TSH 1.907, serum glucose 96, CBC unremarkable, BMP unremarkable Principal Problem: Brief psychotic disorder ([place]) Diagnosis: Principal Problem:   Brief psychotic disorder ([place])  Total Time spent with patient: 15 minutes  Past Psychiatric History:   Past Medical History:  Past Medical History:  Diagnosis Date  . ADHD     Past Surgical History:  Procedure Laterality Date  . BREAST LUMPECTOMY WITH RADIOACTIVE SEED LOCALIZATION Right 10/12/2018   Procedure: RIGHT BREAST LUMPECTOMY WITH RADIOACTIVE SEED LOCALIZATION;  Surgeon: Fanny Skates, MD;  Location: [place];  Service: General;  Laterality:  Right;  . DENTAL SURGERY     Family History: History reviewed. No pertinent family history. Family Psychiatric  History:  Social History:  Social History   Substance and Sexual Activity  Alcohol Use Yes  . Alcohol/week: 1.0 standard drinks  . Types: 1 Glasses of wine per week   Comment: once a month     Social History   Substance and Sexual Activity  Drug Use No    Social History   Socioeconomic History  . Marital status: Single    Spouse name: Not on file  . Number of children: 0  . Years of education: Not on file  . Highest education level: Some college, no degree  Occupational History  . Not on file  Social Needs  . Financial resource strain: Not on file  . Food insecurity    Worry: Not on file    Inability: Not on file  . Transportation needs    Medical: No    Non-medical: No  Tobacco Use  . Smoking status: Never Smoker  . Smokeless tobacco: Never Used  Substance and Sexual Activity  . Alcohol use: Yes    Alcohol/week: 1.0 standard drinks    Types: 1 Glasses of wine per week    Comment: once a month  . Drug use: No  . Sexual activity: Yes    Birth control/protection: None  Lifestyle  . Physical activity    Days per week: 2 days    Minutes per session: 30 min  . Stress: To some extent  Relationships  . Social connections    Talks on phone: More than three times a week    Gets together: Once a week    Attends religious service: Never  Active member of club or organization: No    Attends meetings of clubs or organizations: Never    Relationship status: Living with partner  Other Topics Concern  . Not on file  Social History Narrative  . Not on file   Additional Social History:    Pain Medications: see MAR Prescriptions: see MAR Over the Counter: see MAR History of alcohol / drug use?: Yes Longest period of sobriety (when/how long): N/A, patient states that she does not have problematic use of marijuana and states that she only drinks once  monthly Name of Substance 1: alcohol 1 - Age of First Use: unknonw 1 - Amount (size/oz): 1-2 drinks 1 - Frequency: once monthly 1 - Duration: unknown 1 - Last Use / Amount: unknown  Sleep: Good  Appetite:  Good  Current Medications: Current Facility-Administered Medications  Medication Dose Route Frequency Provider Last Rate Last Dose  . acetaminophen (TYLENOL) tablet 650 mg  650 mg Oral Q6H PRN Ethelene Hal, NP      . alum & mag hydroxide-simeth (MAALOX/MYLANTA) 200-200-20 MG/5ML suspension 30 mL  30 mL Oral Q4H PRN Ethelene Hal, NP      . benztropine (COGENTIN) tablet 0.5 mg  0.5 mg Oral BID Johnn Hai, MD   0.5 mg at 03/26/19 0750  . hydrOXYzine (ATARAX/VISTARIL) tablet 25 mg  25 mg Oral TID PRN Ethelene Hal, NP   25 mg at 03/23/19 2238  . magnesium hydroxide (MILK OF MAGNESIA) suspension 30 mL  30 mL Oral Daily PRN Ethelene Hal, NP      . omega-3 acid ethyl esters (LOVAZA) capsule 1 g  1 g Oral BID Johnn Hai, MD   1 g at 03/26/19 0750  . prenatal multivitamin tablet 1 tablet  1 tablet Oral Q1200 Johnn Hai, MD   1 tablet at 03/25/19 1157  . risperiDONE (RISPERDAL) tablet 2 mg  2 mg Oral BID Johnn Hai, MD   2 mg at 03/26/19 0749  . temazepam (RESTORIL) capsule 15 mg  15 mg Oral QHS Johnn Hai, MD   15 mg at 03/25/19 2050  . traZODone (DESYREL) tablet 50 mg  50 mg Oral QHS PRN Ethelene Hal, NP   50 mg at 03/23/19 2238    Lab Results:  Results for orders placed or performed during the hospital encounter of 03/23/19 (from the past 48 hour(s))  Urinalysis, Complete w Microscopic     Status: Abnormal   Collection Time: 03/25/19 11:39 AM  Result Value Ref Range   Color, Urine STRAW (A) YELLOW   APPearance CLEAR CLEAR   Specific Gravity, Urine 1.003 (L) 1.005 - 1.030   pH 6.0 5.0 - 8.0   Glucose, UA NEGATIVE NEGATIVE mg/dL   Hgb urine dipstick NEGATIVE NEGATIVE   Bilirubin Urine NEGATIVE NEGATIVE   Ketones, ur NEGATIVE  NEGATIVE mg/dL   Protein, ur NEGATIVE NEGATIVE mg/dL   Nitrite NEGATIVE NEGATIVE   Leukocytes,Ua MODERATE (A) NEGATIVE   RBC / HPF 0-5 0 - 5 RBC/hpf   WBC, UA 0-5 0 - 5 WBC/hpf   Bacteria, UA RARE (A) NONE SEEN   Squamous Epithelial / LPF 6-10 0 - 5    Comment: Performed at [facility], [place] [address], [place], [place] [address]  CBC with Differential/Platelet     Status: Abnormal   Collection Time: 03/26/19  6:38 AM  Result Value Ref Range   WBC 5.8 4.0 - 10.5 K/uL   RBC 4.34 3.87 - 5.11 MIL/uL  Hemoglobin 11.5 (L) 12.0 - 15.0 g/dL   HCT 36.1 36.0 - 46.0 %   MCV 83.2 80.0 - 100.0 fL   MCH 26.5 26.0 - 34.0 pg   MCHC 31.9 30.0 - 36.0 g/dL   RDW 15.4 11.5 - 15.5 %   Platelets 217 150 - 400 K/uL   nRBC 0.0 0.0 - 0.2 %   Neutrophils Relative % 40 %   Neutro Abs 2.3 1.7 - 7.7 K/uL   Lymphocytes Relative 47 %   Lymphs Abs 2.7 0.7 - 4.0 K/uL   Monocytes Relative 9 %   Monocytes Absolute 0.5 0.1 - 1.0 K/uL   Eosinophils Relative 3 %   Eosinophils Absolute 0.2 0.0 - 0.5 K/uL   Basophils Relative 1 %   Basophils Absolute 0.0 0.0 - 0.1 K/uL   Immature Granulocytes 0 %   Abs Immature Granulocytes 0.00 0.00 - 0.07 K/uL    Comment: Performed at [facility], [place] [address], [place], [place] [address]  Lipid panel     Status: None   Collection Time: 03/26/19  6:38 AM  Result Value Ref Range   Cholesterol 160 0 - 200 mg/dL   Triglycerides 43 <150 mg/dL   HDL 67 >40 mg/dL   Total CHOL/HDL Ratio 2.4 RATIO   VLDL 9 0 - 40 mg/dL   LDL Cholesterol 84 0 - 99 mg/dL    Comment:        Total Cholesterol/HDL:CHD Risk Coronary Heart Disease Risk Table                     Men   Women  1/2 Average Risk   3.4   3.3  Average Risk       5.0   4.4  2 X Average Risk   9.6   7.1  3 X Average Risk  23.4   11.0        Use the calculated Patient Ratio above and the CHD Risk Table to determine the patient's CHD Risk.        ATP III CLASSIFICATION (LDL):   <100     mg/dL   Optimal  100-129  mg/dL   Near or Above                    Optimal  130-159  mg/dL   Borderline  160-189  mg/dL   High  >190     mg/dL   Very High Performed at [facility] [address], [place], [place] [address]   TSH     Status: None   Collection Time: 03/26/19  6:38 AM  Result Value Ref Range   TSH 1.907 0.350 - 4.500 uIU/mL    Comment: Performed by a 3rd Generation assay with a functional sensitivity of <=0.01 uIU/mL. Performed at [facility], [place] [address], [place], [place] [address]   Basic metabolic panel     Status: Abnormal   Collection Time: 03/26/19  6:38 AM  Result Value Ref Range   Sodium 139 135 - 145 mmol/L   Potassium 3.6 3.5 - 5.1 mmol/L   Chloride 106 98 - 111 mmol/L   CO2 23 22 - 32 mmol/L   Glucose, Bld 96 70 - 99 mg/dL   BUN 8 6 - 20 mg/dL   Creatinine, Ser 0.56 0.44 - 1.00 mg/dL   Calcium 8.7 (L) 8.9 - 10.3 mg/dL   GFR calc non Af Amer >60 >60 mL/min  GFR calc Af Amer >60 >60 mL/min   Anion gap 10 5 - 15    Comment: Performed at [facility], [place] [address] [place], [place] [address]    Blood Alcohol level:  No results found for: [facility]  Metabolic Disorder Labs: No results found for: HGBA1C, MPG No results found for: PROLACTIN Lab Results  Component Value Date   CHOL 160 03/26/2019   TRIG 43 03/26/2019   HDL 67 03/26/2019   CHOLHDL 2.4 03/26/2019   VLDL 9 03/26/2019   LDLCALC 84 03/26/2019    Physical Findings: AIMS: Facial and Oral Movements Muscles of Facial Expression: None, normal Lips and Perioral Area: None, normal Jaw: None, normal Tongue: None, normal,Extremity Movements Upper (arms, wrists, hands, fingers): None, normal Lower (legs, knees, ankles, toes): None, normal, Trunk Movements Neck, shoulders, hips: None, normal, Overall Severity Severity of abnormal movements (highest score from questions above): None, normal Incapacitation due to abnormal  movements: None, normal Patient's awareness of abnormal movements (rate only patient's report): No Awareness, Dental Status Current problems with teeth and/or dentures?: No Does patient usually wear dentures?: No  CIWA:    COWS:     Musculoskeletal: Strength & Muscle Tone: within normal limits Gait & Station: normal Patient leans: N/A  Psychiatric Specialty Exam: Physical Exam  ROS denies chest pain or shortness of breath, no cough, no fever, no chills  Blood pressure 107/64, pulse (!) 123, temperature 98.2 F (36.8 C), temperature source Oral, resp. rate 18, height 5' 0.63" (1.54 m), weight 54 kg.Body mass index is 22.76 kg/m.  General Appearance: Well Groomed  Eye Contact:  Good  Speech:  Normal Rate  Volume:  Normal  Mood:  Denies depression and presents euthymic today  Affect:  Appropriate, reactive  Thought Process:  Linear and Descriptions of Associations: Intact  Orientation:  Other:  Currently presents alert and attentive  Thought Content:  At this time denies hallucinations and no delusions are expressed at present  Suicidal Thoughts:  No denies suicidal or self-injurious ideations, contracts for safety on unit, denies homicidal ideations  Homicidal Thoughts:  No  Memory:  Recent and remote grossly intact  Judgement:  Other:  Improved  Insight:  Fair/improving  Psychomotor Activity:  Normal no current psychomotor agitation or restlessness  Concentration:  Concentration: Good and Attention Span: Good  Recall:  Good  Fund of Knowledge:  Good  Language:  Good  Akathisia:  Negative  Handed:  Right  AIMS (if indicated):     Assets:  Desire for Improvement Resilience  ADL's:  Intact  Cognition:  WNL  Sleep:  Number of Hours: 7.25   Assessment - 23 year old female, presented for paranoid ideations, had described that she felt the television was controlling her.  Collateral information from brother indicated that she was having mood swings and impulsive behaviors such  as leaving with a stranger/missing for several days.  Patient is presenting calm, well related and behavior in good control, denies current psychotic symptoms.  Tolerating medications well thus far.  Denies SI.   Treatment Plan Summary: Daily contact with patient to assess and evaluate symptoms and progress in treatment, Medication management, Plan Inpatient treatment and Medication as below  Treatment plan reviewed as below today 8/9 Encourage group and milieu participation Continue Risperdal 2 mg twice daily for psychosis Continue Restoril 15 mg nightly for insomnia Continue trazodone 50 mg nightly PRN for insomnia Continue Vistaril 25 mg 3 times daily PRN for anxiety Continue Cogentin 0.5 mg twice daily to  prevent antipsychotic-induced side effects Treatment team working on disposition planning options Jenne Campus, MD 03/26/2019, 10:49 AM   Patient ID: Jacqueline [name], female   DOB: 1996-02-15, 23 y.o.   MRN: [id number]

## 2019-03-26 NOTE — Progress Notes (Signed)
D: Patient is pleasant and cooperative. Patient denies SI/HI/AVH. 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 She feels that working on her degree created stress that precipitated her coming to the hospital. Per self-inventory: Patient slept well last night, and received medication that was helpful. Her appetite is good, energy normal and concentration good. She rates her depression, hopelessness and anxiety 0/10. She denies physical symptoms or withdrawal. She denies SI. Goal: "Stay calm and think about future goals" and "think positive and go home."  A: Patient checked q15 min, and checks reviewed. Reviewed medication changes with patient and educated on side effects. Educated patient on importance of attending group therapy sessions and educated on several coping skills. Encouarged participation in milieu through recreation therapy and attending meals with peers. Support and encouragement provided. Fluids offered. R: Patient receptive to education on medications, and is medication compliant. Patient contracts for safety on the unit.

## 2019-03-27 MED ORDER — OMEGA-3-ACID ETHYL ESTERS 1 G PO CAPS: 1.0000 g | ORAL_CAPSULE | Freq: Two times a day (BID) | ORAL | 11 refills | Status: DC

## 2019-03-27 MED ORDER — TEMAZEPAM 15 MG PO CAPS: 15.0000 mg | ORAL_CAPSULE | Freq: Every evening | ORAL | 0 refills | Status: DC | PRN

## 2019-03-27 MED ORDER — BENZTROPINE MESYLATE 0.5 MG PO TABS: 0.5000 mg | ORAL_TABLET | Freq: Two times a day (BID) | ORAL | 2 refills | Status: AC

## 2019-03-27 MED ORDER — ENBRACE HR PO CAPS: 1.0000 | ORAL_CAPSULE | Freq: Every day | ORAL | 11 refills | Status: DC

## 2019-03-27 MED ORDER — RISPERIDONE 4 MG PO TABS: 4.0000 mg | ORAL_TABLET | Freq: Every day | ORAL | 2 refills | Status: AC

## 2019-03-27 MED ORDER — RISPERIDONE 2 MG PO TABS: 4.0000 mg | ORAL_TABLET | Freq: Every day | ORAL | Status: DC

## 2019-03-27 NOTE — Discharge Summary (Signed)
Physician Discharge Summary Note  Patient:  Jacqueline [name] is an 23 y.o., female MRN:  [id number] DOB:  1995-12-16 Patient phone:  [phone] (home)  Patient address:   [place] [address],  Total Time spent with patient: 45 minutes  Date of Admission:  03/23/2019 Date of Discharge: 03/27/2019  Reason for Admission:   As discussed in the HPI, this was the first psychiatric admission here or elsewhere for 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  See the extensive admission note for fuller details.  Despite her initial psychosis she was always fully cooperative and compliant here.  She displayed no danger behaviors here.  Again see the admission note, drug screen positive for cannabis as expected.  Principal Problem: Brief psychotic disorder [facility]) Discharge Diagnoses: Principal Problem:   Brief psychotic disorder [facility])   Past Psychiatric History: neg/  Past Medical History:  Past Medical History:  Diagnosis Date  . ADHD     Past Surgical History:  Procedure Laterality Date  . BREAST LUMPECTOMY WITH RADIOACTIVE SEED LOCALIZATION Right 10/12/2018   Procedure: RIGHT BREAST LUMPECTOMY WITH RADIOACTIVE SEED LOCALIZATION;  Surgeon: Fanny Skates, MD;  Location: [place];  Service: General;  Laterality: Right;  . DENTAL SURGERY     Family History: History reviewed. No pertinent family history. Family Psychiatric  History: neg Social History:  Social History   Substance and Sexual Activity  Alcohol Use Yes  . Alcohol/week: 1.0 standard drinks  . Types: 1 Glasses of wine per week   Comment: once a month     Social History   Substance and Sexual Activity  Drug Use No    Social History   Socioeconomic History  . Marital status: Single    Spouse name: Not on file  . Number of children: 0  . Years of education: Not on file  . Highest education level: Some college, no degree   Occupational History  . Not on file  Social Needs  . Financial resource strain: Not on file  . Food insecurity    Worry: Not on file    Inability: Not on file  . Transportation needs    Medical: No    Non-medical: No  Tobacco Use  . Smoking status: Never Smoker  . Smokeless tobacco: Never Used  Substance and Sexual Activity  . Alcohol use: Yes    Alcohol/week: 1.0 standard drinks    Types: 1 Glasses of wine per week    Comment: once a month  . Drug use: No  . Sexual activity: Yes    Birth control/protection: None  Lifestyle  . Physical activity    Days per week: 2 days    Minutes per session: 30 min  . Stress: To some extent  Relationships  . Social connections    Talks on phone: More than three times a week    Gets together: Once a week    Attends religious service: Never    Active member of club or organization: No    Attends meetings of clubs or organizations: Never    Relationship status: Living with partner  Other Topics Concern  . Not on file  Social History Narrative  . Not on file    Hospital Course:   Patient was always pleasant and cooperative she did have some ideas of reference believe the TV was controlling her however this resolved completely by 8/9 and she was compliant with low-dose Risperdal, and  recalibrated to her baseline status and was stable for discharge by 8/10.  On this date I found her to be alert fully oriented cooperative pleasant without thoughts of harming self or others without acute psychosis that is the absence of any hallucinations the absence of any disorganized thought or behavior the absence of delusions.  We explained to her our treatment plan was to fully wanted to treat the acute problem of course and make sure she would be drug-free going forward, however we felt that neuro protection was key and that this is a schizophreniform disorder and the use of omega-3's, in addition to antioxidants and B vitamins may prevent this from ever  becoming a permanent psychosis and she is given articles on this information.  She has no thoughts of harming self or others at the point of discharge as discussed she seems very much baseline no EPS or TD.  Discharged on the meds listed below  Physical Findings: AIMS: Facial and Oral Movements Muscles of Facial Expression: None, normal Lips and Perioral Area: None, normal Jaw: None, normal Tongue: None, normal,Extremity Movements Upper (arms, wrists, hands, fingers): None, normal Lower (legs, knees, ankles, toes): None, normal, Trunk Movements Neck, shoulders, hips: None, normal, Overall Severity Severity of abnormal movements (highest score from questions above): None, normal Incapacitation due to abnormal movements: None, normal Patient's awareness of abnormal movements (rate only patient's report): No Awareness, Dental Status Current problems with teeth and/or dentures?: No Does patient usually wear dentures?: No  CIWA:    COWS:     Musculoskeletal: Strength & Muscle Tone: within normal limits Gait & Station: normal Patient leans: N/A  Psychiatric Specialty Exam: ROS  Blood pressure 108/67, pulse (!) 120, temperature 98.5 F (36.9 C), temperature source Oral, resp. rate 18, height 5' 0.63" (1.54 m), weight 54 kg.Body mass index is 22.76 kg/m.  General Appearance: Casual  Eye Contact::  Good  Speech:  Clear and Coherent409  Volume:  Normal  Mood:  Euthymic  Affect:  Full Range  Thought Process:  Coherent and Descriptions of Associations: Intact  Orientation:  Full (Time, Place, and Person)  Thought Content:  Logical  Suicidal Thoughts:  No  Homicidal Thoughts:  No  Memory:  Recent;   Good  Judgement:  Intact  Insight:  Fair  Psychomotor Activity:  Normal  Concentration:  Good  Recall:  nl  Fund of Knowledge:Good  Language: Good  Akathisia:  Negative  Handed:  Right  AIMS (if indicated):     Assets:  Communication Skills Desire for Improvement  Sleep:  Number  of Hours: 6.75  Cognition: WNL  ADL's:  Intact     Have you used any form of tobacco in the last 30 days? (Cigarettes, Smokeless Tobacco, Cigars, and/or Pipes): Yes  Has this patient used any form of tobacco in the last 30 days? (Cigarettes, Smokeless Tobacco, Cigars, and/or Pipes) Yes, No  Blood Alcohol level:  No results found for: [facility]  Metabolic Disorder Labs:  Lab Results  Component Value Date   HGBA1C 5.4 03/26/2019   MPG 108.28 03/26/2019   No results found for: PROLACTIN Lab Results  Component Value Date   CHOL 160 03/26/2019   TRIG 43 03/26/2019   HDL 67 03/26/2019   CHOLHDL 2.4 03/26/2019   VLDL 9 03/26/2019   LDLCALC 84 03/26/2019    See Psychiatric Specialty Exam and Suicide Risk Assessment completed by Attending Physician prior to discharge.  Discharge destination:  Home  Is patient on multiple antipsychotic therapies at  discharge:  No   Has Patient had three or more failed trials of antipsychotic monotherapy by history:  No  Recommended Plan for Multiple Antipsychotic Therapies: NA   Allergies as of 03/27/2019   No Known Allergies     Medication List    STOP taking these medications   amphetamine-dextroamphetamine 20 MG 24 hr capsule Commonly known as: ADDERALL XR   HYDROcodone-acetaminophen 5-325 MG tablet Commonly known as: Norco     TAKE these medications     Indication  acetaminophen 325 MG tablet Commonly known as: TYLENOL Take 650 mg by mouth every 6 (six) hours as needed for mild pain or headache.  Indication: Pain   benztropine 0.5 MG tablet Commonly known as: COGENTIN Take 1 tablet (0.5 mg total) by mouth 2 (two) times daily.  Indication: Extrapyramidal Reaction caused by Medications   EnBrace HR Caps Take 1 capsule by mouth daily. If not covered go to [email or web address]com to fill  Indication: 3-Hydroxy-3-Methylglutaric Acid in the Urine   omega-3 acid ethyl esters 1 g capsule Commonly known as: LOVAZA Take 1 capsule (1 g total) by  mouth 2 (two) times daily.  Indication: High Amount of Triglycerides in the Blood   risperidone 4 MG tablet Commonly known as: RISPERDAL Take 1 tablet (4 mg total) by mouth at bedtime.  Indication: Hypomanic Episode of Bipolar Disorder, Schizophrenia   temazepam 15 MG capsule Commonly known as: RESTORIL Take 1 capsule (15 mg total) by mouth at bedtime as needed for sleep.  Indication: Trouble Sleeping       SignedJohnn Hai, MD 03/27/2019, 7:39 AM

## 2019-03-27 NOTE — [facility] Suicide Risk Assessment (Signed)
[facility] INPATIENT:  Family/Significant Other Suicide Prevention Education  Suicide Prevention Education:  Education Completed; Pt's brother, Shawntel Farnworth,  (name of family member/significant other) has been identified by the patient as the family member/significant other with whom the patient will be residing, and identified as the person(s) who will aid the patient in the event of a mental health crisis (suicidal ideations/suicide attempt).  With written consent from the patient, the family member/significant other has been provided the following suicide prevention education, prior to the and/or following the discharge of the patient.  The suicide prevention education provided includes the following:  Suicide risk factors  Suicide prevention and interventions  National Suicide Hotline telephone number  [facility] assessment telephone number  [facility] Emergency Assistance [place] and/or Residential Mobile Crisis Unit telephone number  Request made of family/significant other to:  Remove weapons (e.g., guns, rifles, knives), all items previously/currently identified as safety concern.    Remove drugs/medications (over-the-counter, prescriptions, illicit drugs), all items previously/currently identified as a safety concern.  The family member/significant other verbalizes understanding of the suicide prevention education information provided.  The family member/significant other agrees to remove the items of safety concern listed above.  CSW contacted pt's brother, Anetra Czerwinski. Pt's brother asked about her medication and her aftercare. He stated that he does not have any questions or concerns but did ask about when he could pick her up since she is being discharged today.   Trecia Rogers 03/27/2019, 10:25 AM

## 2019-03-27 NOTE — Progress Notes (Signed)
Pt discharged to lobby. Pt was stable and appreciative at that time. All papers, samples and prescriptions were given and valuables returned. Verbal understanding expressed. Denies SI/HI and A/VH. Pt given opportunity to express concerns and ask questions.  

## 2019-03-27 NOTE — Tx Team (Signed)
Interdisciplinary Treatment and Diagnostic Plan Update  03/27/2019 Time of Session: 09:18am Jacqueline [name] MRN: [id number]  Principal Diagnosis: Brief psychotic disorder [facility])  Secondary Diagnoses: Principal Problem:   Brief psychotic disorder ([place])   Current Medications:  Current Facility-Administered Medications  Medication Dose Route Frequency Provider Last Rate Last Dose  . acetaminophen (TYLENOL) tablet 650 mg  650 mg Oral Q6H PRN Ethelene Hal, NP      . alum & mag hydroxide-simeth (MAALOX/MYLANTA) 200-200-20 MG/5ML suspension 30 mL  30 mL Oral Q4H PRN Ethelene Hal, NP      . benztropine (COGENTIN) tablet 0.5 mg  0.5 mg Oral BID Johnn Hai, MD   0.5 mg at 03/27/19 0747  . hydrOXYzine (ATARAX/VISTARIL) tablet 25 mg  25 mg Oral TID PRN Ethelene Hal, NP   25 mg at 03/23/19 2238  . magnesium hydroxide (MILK OF MAGNESIA) suspension 30 mL  30 mL Oral Daily PRN Ethelene Hal, NP      . omega-3 acid ethyl esters (LOVAZA) capsule 1 g  1 g Oral BID Johnn Hai, MD   1 g at 03/27/19 0747  . prenatal multivitamin tablet 1 tablet  1 tablet Oral Q1200 Johnn Hai, MD   1 tablet at 03/26/19 1200  . risperiDONE (RISPERDAL) tablet 2 mg  2 mg Oral BID Johnn Hai, MD   2 mg at 03/27/19 0747  . temazepam (RESTORIL) capsule 15 mg  15 mg Oral QHS Johnn Hai, MD   15 mg at 03/26/19 2046  . traZODone (DESYREL) tablet 50 mg  50 mg Oral QHS PRN Ethelene Hal, NP   50 mg at 03/23/19 2238   PTA Medications: Medications Prior to Admission  Medication Sig Dispense Refill Last Dose  . acetaminophen (TYLENOL) 325 MG tablet Take 650 mg by mouth every 6 (six) hours as needed for mild pain or headache.     . amphetamine-dextroamphetamine (ADDERALL XR) 20 MG 24 hr capsule Take 20 mg by mouth 2 (two) times daily.     Marland Kitchen HYDROcodone-acetaminophen (NORCO) 5-325 MG tablet Take 1-2 tablets by mouth every 6 (six) hours as needed for moderate pain or severe pain. (Patient  not taking: Reported on 03/24/2019) 20 tablet 0 Completed Course at Unknown time    Patient Stressors: Educational concerns Financial difficulties  Patient Strengths: Average or above average intelligence Capable of independent living Motivation for treatment/growth Supportive family/friends  Treatment Modalities: Medication Management, Group therapy, Case management,  1 to 1 session with clinician, Psychoeducation, Recreational therapy.   Physician Treatment Plan for Primary Diagnosis: Brief psychotic disorder [facility]) Long Term Goal(s): Improvement in symptoms so as ready for discharge Improvement in symptoms so as ready for discharge   Short Term Goals: Ability to verbalize feelings will improve Ability to disclose and discuss suicidal ideas Ability to identify and develop effective coping behaviors will improve Ability to demonstrate self-control will improve Ability to identify and develop effective coping behaviors will improve Compliance with prescribed medications will improve  Medication Management: Evaluate patient's response, side effects, and tolerance of medication regimen.  Therapeutic Interventions: 1 to 1 sessions, Unit Group sessions and Medication administration.  Evaluation of Outcomes: Adequate for Discharge  Physician Treatment Plan for Secondary Diagnosis: Principal Problem:   Brief psychotic disorder ([place])  Long Term Goal(s): Improvement in symptoms so as ready for discharge Improvement in symptoms so as ready for discharge   Short Term Goals: Ability to verbalize feelings will improve Ability to disclose and discuss suicidal ideas Ability to  identify and develop effective coping behaviors will improve Ability to demonstrate self-control will improve Ability to identify and develop effective coping behaviors will improve Compliance with prescribed medications will improve     Medication Management: Evaluate patient's response, side effects, and tolerance  of medication regimen.  Therapeutic Interventions: 1 to 1 sessions, Unit Group sessions and Medication administration.  Evaluation of Outcomes: Adequate for Discharge   RN Treatment Plan for Primary Diagnosis: Brief psychotic disorder ([place]) Long Term Goal(s): Knowledge of disease and therapeutic regimen to maintain health will improve  Short Term Goals: Ability to participate in decision making will improve, Ability to verbalize feelings will improve, Ability to disclose and discuss suicidal ideas, Ability to identify and develop effective coping behaviors will improve and Compliance with prescribed medications will improve  Medication Management: RN will administer medications as ordered by provider, will assess and evaluate patient's response and provide education to patient for prescribed medication. RN will report any adverse and/or side effects to prescribing provider.  Therapeutic Interventions: 1 on 1 counseling sessions, Psychoeducation, Medication administration, Evaluate responses to treatment, Monitor vital signs and CBGs as ordered, Perform/monitor CIWA, COWS, AIMS and Fall Risk screenings as ordered, Perform wound care treatments as ordered.  Evaluation of Outcomes: Adequate for Discharge   LCSW Treatment Plan for Primary Diagnosis: Brief psychotic disorder [facility]) Long Term Goal(s): Safe transition to appropriate next level of care at discharge, Engage patient in therapeutic group addressing interpersonal concerns.  Short Term Goals: Engage patient in aftercare planning with referrals and resources and Increase skills for wellness and recovery  Therapeutic Interventions: Assess for all discharge needs, 1 to 1 time with Social worker, Explore available resources and support systems, Assess for adequacy in community support network, Educate family and significant other(s) on suicide prevention, Complete Psychosocial Assessment, Interpersonal group therapy.  Evaluation of Outcomes:  Adequate for Discharge   Progress in Treatment: Attending groups: No. Participating in groups: No. Taking medication as prescribed: As evidenced by:  Medications have not been assigned, yet Toleration medication: As evidenced by:  Medications have not been assigned, yet Family/Significant other contact made: Yes, individual(s) contacted:  pts brother Patient understands diagnosis: No. Discussing patient identified problems/goals with staff: Yes. Medical problems stabilized or resolved: Yes. Denies suicidal/homicidal ideation: Yes. Issues/concerns per patient self-inventory: No. Other:   New problem(s) identified: No, Describe:  None  New Short Term/Long Term Goal(s): Medication stabilization, elimination of SI thoughts, and development of a comprehensive mental wellness plan.   Patient Goals:    Discharge Plan or Barriers: SPE pamphlet, Mobile Crisis information, and AA/NA information provided to patient for additional community support and resources. Pt has an appointment scheduled with [facility] on 8/12 and an appointment with her PCP on 9/15.  Reason for Continuation of Hospitalization: none  Estimated Length of Stay: Today 03/27/2019  Attendees: Patient: 03/24/2019   Physician: Dr. Johnn Hai, MD 03/24/2019   Nursing: Benjamine Mola, RN 03/24/2019   RN Care Manager: 03/24/2019   Social Worker: Evalina Field, LCSW 03/24/2019   Recreational Therapist:  03/24/2019  Other:  03/24/2019   Other:  03/24/2019   Other: 03/24/2019      Scribe for Treatment Team: Mariann Laster Jovonda Selner, LCSW 03/27/2019 11:27 AM

## 2019-03-27 NOTE — [facility]HH Suicide Risk Assessment (Signed)
[facility] Discharge Suicide Risk Assessment   Principal Problem: Brief psychotic disorder [facility]) Discharge Diagnoses: Principal Problem:   Brief psychotic disorder ([place])   Total Time spent with patient: 45 minutes  Musculoskeletal: Strength & Muscle Tone: within normal limits Gait & Station: normal Patient leans: N/A  Psychiatric Specialty Exam: ROS  Blood pressure 108/67, pulse (!) 120, temperature 98.5 F (36.9 C), temperature source Oral, resp. rate 18, height 5' 0.63" (1.54 m), weight 54 kg.Body mass index is 22.76 kg/m.  General Appearance: Casual  Eye Contact::  Good  Speech:  Clear and Coherent409  Volume:  Normal  Mood:  Euthymic  Affect:  Full Range  Thought Process:  Coherent and Descriptions of Associations: Intact  Orientation:  Full (Time, Place, and Person)  Thought Content:  Logical  Suicidal Thoughts:  No  Homicidal Thoughts:  No  Memory:  Recent;   Good  Judgement:  Intact  Insight:  Fair  Psychomotor Activity:  Normal  Concentration:  Good  Recall:  nl  Fund of Knowledge:Good  Language: Good  Akathisia:  Negative  Handed:  Right  AIMS (if indicated):     Assets:  Communication Skills Desire for Improvement  Sleep:  Number of Hours: 6.75  Cognition: WNL  ADL's:  Intact   Mental Status Per Nursing Assessment::   On Admission:  Suicidal ideation indicated by patient, Self-harm thoughts  Demographic Factors:  Unemployed  Loss Factors: NA  Historical Factors: NA  Risk Reduction Factors:   Sense of responsibility to family and Religious beliefs about death  Continued Clinical Symptoms:  stable  Cognitive Features That Contribute To Risk:  None    Suicide Risk:  Minimal: No identifiable suicidal ideation.  Patients presenting with no risk factors but with morbid ruminations; may be classified as minimal risk based on the severity of the depressive symptoms    Plan Of Care/Follow-up recommendations:  Activity:  full  Kabe Mckoy,  MD 03/27/2019, 7:35 AM

## 2019-03-27 NOTE — Progress Notes (Signed)
  [facility] Adult Case Management Discharge Plan :  Will you be returning to the same living situation after discharge:  Yes,  home At discharge, do you have transportation home?: Yes,  pt's brother Do you have the ability to pay for your medications: No.; [facility]  Release of information consent forms completed and in the chart;  Patient's signature needed at discharge.  Patient to Follow up at: Follow-up Information    [facility] Follow up on 03/29/2019.   Why: Telephonic hospital follow up appointment is Wednesday, 8/12 at 10:30a.  The provider will contact you.  Contact information: [place] [id number] [facility] Follow up on 05/02/2019.   Why: Virtual appointment is Tuesday, 9/15 at 1:50p.  The appointment information is available in your MyChart. The appt will be done through the MyChart app.    Contact information: [address] [place] [address] [phone]          Next level of care provider has access to [place] and Suicide Prevention discussed: Yes,  pt's brother  Have you used any form of tobacco in the last 30 days? (Cigarettes, Smokeless Tobacco, Cigars, and/or Pipes): Yes  Has patient been referred to the Quitline?: N/A patient is not a smoker  Patient has been referred for addiction treatment: Yes  Trecia Rogers, LCSW 03/27/2019, 11:15 AM

## 2019-03-27 NOTE — Progress Notes (Signed)
Patient ID: Jacqueline [name], female   DOB: 1996-03-13, 23 y.o.   MRN: [id number] D: Patient observed watching TV and interacting well with peers on approach. Pt was calm, cooperative and appreciative of the help she has received. Pt stated she was tolerating medication well. Pt attended evening wrap up group and Interacted appropriately with peers. Denies  SI/HI/AVH and pain.No behavioral issues noted.  A: Support and encouragement offered as needed to express needs. Medications administered as prescribed.  R: Patient is safe and cooperative on unit. Will continue to monitor  for safety and stability.

## 2019-04-21 IMAGING — DX BREAST SURGICAL SPECIMEN
1 series · 2 of 2 positions shown · non-contrast
Comparison: Previous exam(s).

CLINICAL DATA: Status post seed localized excision of RIGHT breast
lesion.

EXAM:
SPECIMEN RADIOGRAPH OF THE RIGHT BREAST

[Series 2: specimen digital x-ray, derived · right · 2 of 2 slices shown]
[im 1/2]
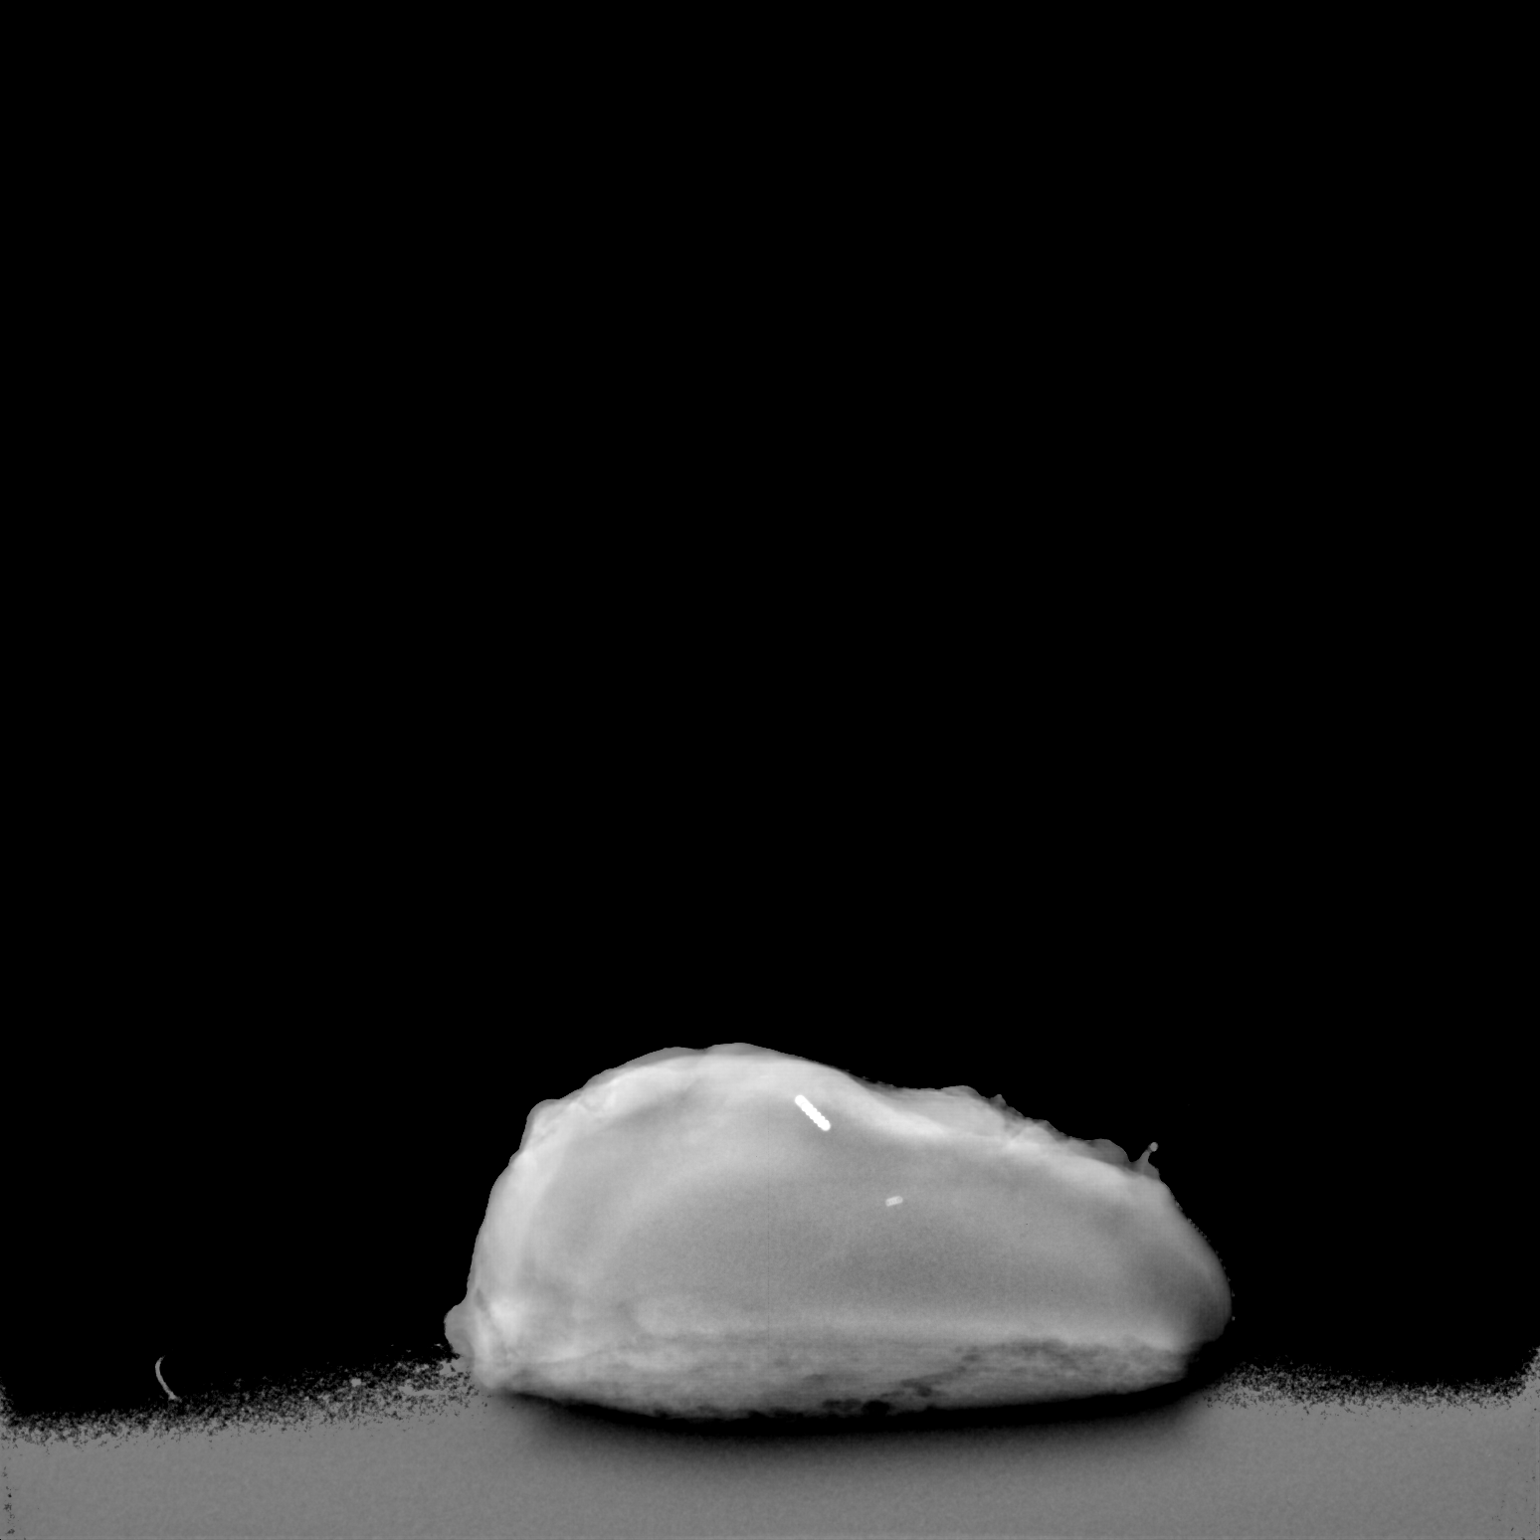
[im 2/2]
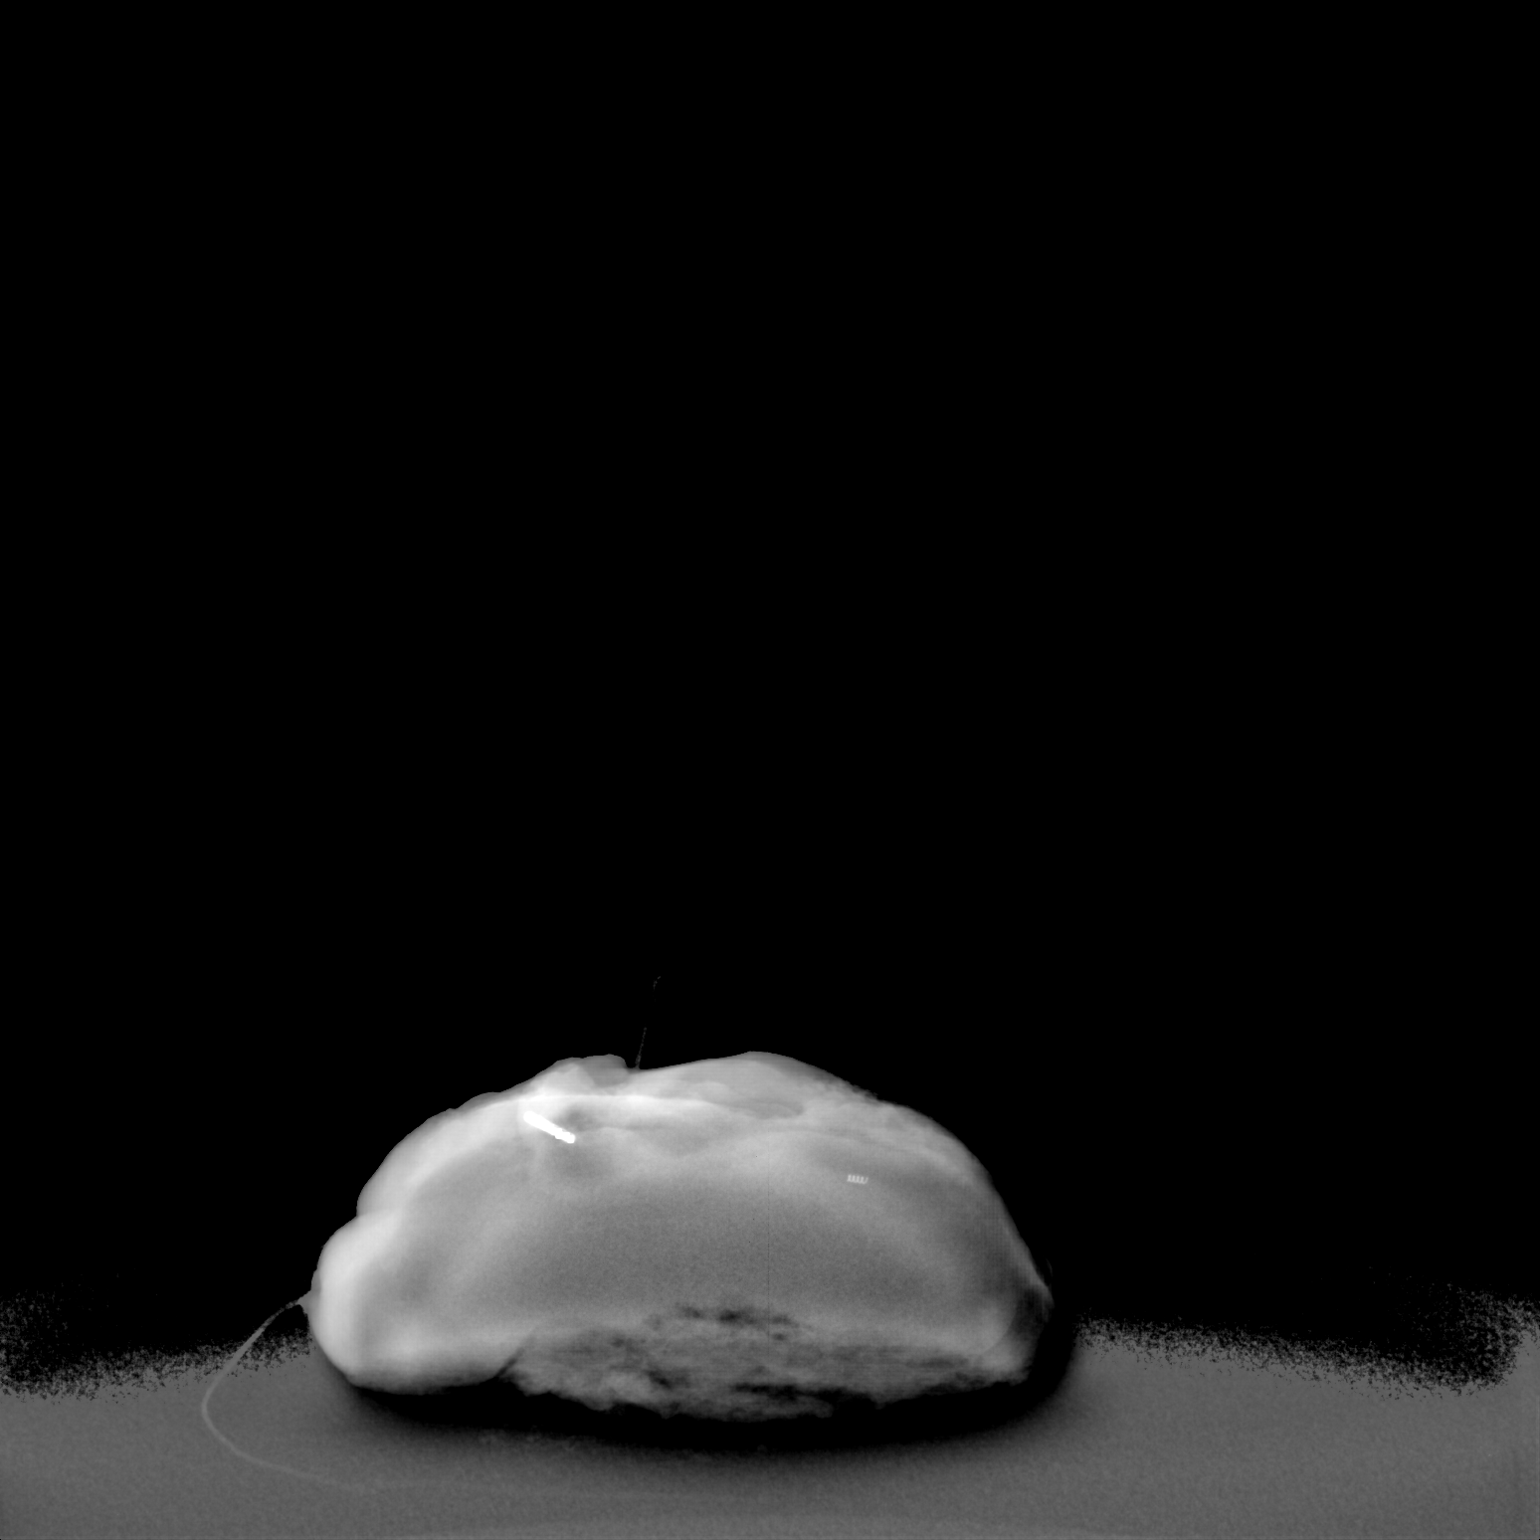

[2 of 2 positions shown; findings below may reference images not displayed]

FINDINGS: Status post excision of the right breast. The radioactive seed and
biopsy marker clip are present incompletely intact.
IMPRESSION: Specimen radiograph of the right breast.

## 2019-05-02 ENCOUNTER — Telehealth: Payer: PRIVATE HEALTH INSURANCE | Admitting: Nurse Practitioner

## 2019-05-03 ENCOUNTER — Telehealth: Payer: Self-pay | Admitting: Licensed Clinical Social Worker

## 2019-05-03 ENCOUNTER — Other Ambulatory Visit: Payer: Self-pay

## 2019-05-03 ENCOUNTER — Ambulatory Visit: Payer: Self-pay | Attending: Family Medicine | Admitting: Licensed Clinical Social Worker

## 2019-05-03 ENCOUNTER — Ambulatory Visit (INDEPENDENT_AMBULATORY_CARE_PROVIDER_SITE_OTHER): Payer: Self-pay | Admitting: Nurse Practitioner

## 2019-05-03 DIAGNOSIS — F419 Anxiety disorder, unspecified: Secondary | ICD-10-CM

## 2019-05-03 DIAGNOSIS — F32A Depression, unspecified: Secondary | ICD-10-CM

## 2019-05-03 DIAGNOSIS — F12988 Cannabis use, unspecified with other cannabis-induced disorder: Secondary | ICD-10-CM

## 2019-05-03 DIAGNOSIS — Z7689 Persons encountering health services in other specified circumstances: Secondary | ICD-10-CM

## 2019-05-03 DIAGNOSIS — F329 Major depressive disorder, single episode, unspecified: Secondary | ICD-10-CM

## 2019-05-03 NOTE — Progress Notes (Signed)
Virtual Visit via Telephone Note Due to national recommendations of social distancing due to [place] 19, telehealth visit is felt to be most appropriate for this patient at this time.  I discussed the limitations, risks, security and privacy concerns of performing an evaluation and management service by telephone and the availability of in person appointments. I also discussed with the patient that there may be a patient responsible charge related to this service. The patient expressed understanding and agreed to proceed.    I connected with [place] Crane on 05/03/19  at   1:30 PM EDT  EDT by telephone and verified that I am speaking with the correct person using two identifiers.   Consent I discussed the limitations, risks, security and privacy concerns of performing an evaluation and management service by telephone and the availability of in person appointments. I also discussed with the patient that there may be a patient responsible charge related to this service. The patient expressed understanding and agreed to proceed.   Location of Patient: Private Residence   Location of Provider: [place] and [employer] Office    Persons participating in Telemedicine visit: Geryl Rankins FNP-BC [place]    History of Present Illness: Telemedicine visit for: Establish Care  has a past medical history of ADHD.  Endorses paranoia, agitation, anxious, tearful, mood lability, memory issues. Her father passed away 2-3 years ago and states since then she has been more depressed. Was going to college and had her own place but states "then everything changed and I couldn't keep up". She dropped out of school and now lives with her mother. Feels stressed at home and states her family burdens her and she feels she has to take care of them. Her mother does not drive so she has to take her mother everywhere although there are other family members in the home who can drive as  well. She was seeing a psychiatrist in the past however is very vague in regard to why she does not see a therapist now.  She states she had an eating disorder and PTSD from traumatic events in the past. Took adderall 20mg  daily in the past and at one point did not sleep for 2 weeks.  States her family felt she was abusing it. She states she took SSRI in the past and does not wish to restart.  Currently taking cogentin 0.5 mg BID. I have referred her to our onsite LCSW today for additional resources and further evaluation. She currently denies any thoughts of self harm.   Depression screen PHQ 2/9 05/03/2019  Decreased Interest 2  Down, Depressed, Hopeless 2  PHQ - 2 Score 4  Altered sleeping 3  Tired, decreased energy 2  Change in appetite 1  Feeling bad or failure about yourself  2  Trouble concentrating 2  Moving slowly or fidgety/restless 1  Suicidal thoughts 0  PHQ-9 Score 15   GAD 7 : Generalized Anxiety Score 05/03/2019  Nervous, Anxious, on Edge 2  Control/stop worrying 2  Worry too much - different things 2  Trouble relaxing 2  Restless 1  Easily annoyed or irritable 2  Afraid - awful might happen 2  Total GAD 7 Score 13     Past Medical History:  Diagnosis Date  . ADHD     Past Surgical History:  Procedure Laterality Date  . BREAST LUMPECTOMY WITH RADIOACTIVE SEED LOCALIZATION Right 10/12/2018   Procedure: RIGHT BREAST LUMPECTOMY WITH RADIOACTIVE SEED LOCALIZATION;  Surgeon: Fanny Skates,  MD;  Location: [facility];  Service: General;  Laterality: Right;  . DENTAL SURGERY      No family history on file.  Social History   Socioeconomic History  . Marital status: Single    Spouse name: Not on file  . Number of children: 0  . Years of education: Not on file  . Highest education level: Some college, no degree  Occupational History  . Not on file  Social Needs  . Financial resource strain: Not on file  . Food insecurity    Worry: Not on file     Inability: Not on file  . Transportation needs    Medical: No    Non-medical: No  Tobacco Use  . Smoking status: Never Smoker  . Smokeless tobacco: Never Used  Substance and Sexual Activity  . Alcohol use: Yes    Alcohol/week: 1.0 standard drinks    Types: 1 Glasses of wine per week    Comment: once a month  . Drug use: No  . Sexual activity: Yes    Birth control/protection: None  Lifestyle  . Physical activity    Days per week: 2 days    Minutes per session: 30 min  . Stress: To some extent  Relationships  . Social connections    Talks on phone: More than three times a week    Gets together: Once a week    Attends religious service: Never    Active member of club or organization: No    Attends meetings of clubs or organizations: Never    Relationship status: Living with partner  Other Topics Concern  . Not on file  Social History Narrative  . Not on file     Observations/Objective: Awake, alert and oriented x 3   Review of Systems  Constitutional: Negative for fever, malaise/fatigue and weight loss.  HENT: Negative.  Negative for nosebleeds.   Eyes: Negative.  Negative for blurred vision, double vision and photophobia.  Respiratory: Negative.  Negative for cough and shortness of breath.   Cardiovascular: Negative.  Negative for chest pain, palpitations and leg swelling.  Gastrointestinal: Negative.  Negative for heartburn, nausea and vomiting.  Musculoskeletal: Negative.  Negative for myalgias.  Neurological: Negative.  Negative for dizziness, focal weakness, seizures and headaches.  Psychiatric/Behavioral: Positive for depression and substance abuse (history of THC). Negative for suicidal ideas. The patient is nervous/anxious.        SEE HPI    Assessment and Plan: Hollye was seen today for establish care.  Diagnoses and all orders for this visit:  Encounter to establish care  Anxiety and depression Referred to [facility] L[facility] for telehealth visit today    Follow Up Instructions Return in about 3 months (around 08/02/2019).     I discussed the assessment and treatment plan with the patient. The patient was provided an opportunity to ask questions and all were answered. The patient agreed with the plan and demonstrated an understanding of the instructions.   The patient was advised to call back or seek an in-person evaluation if the symptoms worsen or if the condition fails to improve as anticipated.  I provided 24 minutes of non-face-to-face time during this encounter including median intraservice time, reviewing previous notes, labs, imaging, medications and explaining diagnosis and management.  Gildardo Pounds, FNP-BC

## 2019-05-03 NOTE — Telephone Encounter (Signed)
Call placed to patient regarding scheduled behavioral health appointment. LCSW was unable to leave message due to voicemail being full.

## 2020-06-10 ENCOUNTER — Encounter: Payer: Self-pay | Admitting: Registered Nurse

## 2020-06-10 ENCOUNTER — Ambulatory Visit (INDEPENDENT_AMBULATORY_CARE_PROVIDER_SITE_OTHER): Payer: Self-pay | Admitting: Registered Nurse

## 2020-06-10 ENCOUNTER — Other Ambulatory Visit: Payer: Self-pay

## 2020-06-10 VITALS — Temp 98.0°F | Resp 17

## 2020-06-10 DIAGNOSIS — A539 Syphilis, unspecified: Secondary | ICD-10-CM

## 2020-06-10 MED ORDER — PENICILLIN G BENZATHINE 1200000 UNIT/2ML IM SUSP
2.4000 10*6.[IU] | Freq: Once | INTRAMUSCULAR | Status: AC
  Administered 2020-06-10: 2.4 10*6.[IU] via INTRAMUSCULAR

## 2020-06-10 NOTE — Progress Notes (Signed)
Established Patient Office Visit  Subjective:  Patient ID: Jacqueline [name], female    DOB: 1996/06/11  Age: 24 y.o. MRN: [id number]  CC: No chief complaint on file.   HPI Jacqueline [name] presents for positive STD test  Tested positive for Syphilis Unsure of origin. In monogamous relationship. Unsure of last testing Notes that she does share hygiene products including razors with a good friend, unsure of friend's status No symptoms Notes that she does not have penicillin allergy No hx of positive RPR  Past Medical History:  Diagnosis Date  . ADHD     Past Surgical History:  Procedure Laterality Date  . BREAST LUMPECTOMY WITH RADIOACTIVE SEED LOCALIZATION Right 10/12/2018   Procedure: RIGHT BREAST LUMPECTOMY WITH RADIOACTIVE SEED LOCALIZATION;  Surgeon: Fanny Skates, MD;  Location: [facility];  Service: General;  Laterality: Right;  . DENTAL SURGERY      No family history on file.  Social History   Socioeconomic History  . Marital status: Single    Spouse name: Not on file  . Number of children: 0  . Years of education: Not on file  . Highest education level: Some college, no degree  Occupational History  . Not on file  Tobacco Use  . Smoking status: Never Smoker  . Smokeless tobacco: Never Used  Vaping Use  . Vaping Use: Former  Substance and Sexual Activity  . Alcohol use: Yes    Alcohol/week: 1.0 standard drink    Types: 1 Glasses of wine per week    Comment: once a month  . Drug use: No  . Sexual activity: Yes    Birth control/protection: None  Other Topics Concern  . Not on file  Social History Narrative  . Not on file   Social Determinants of Health   Financial Resource Strain:   . Difficulty of Paying Living Expenses: Not on file  Food Insecurity:   . Worried About Charity fundraiser in the Last Year: Not on file  . Ran Out of Food in the Last Year: Not on file  Transportation Needs:   . Lack of Transportation (Medical): Not  on file  . Lack of Transportation (Non-Medical): Not on file  Physical Activity:   . Days of Exercise per Week: Not on file  . Minutes of Exercise per Session: Not on file  Stress:   . Feeling of Stress : Not on file  Social Connections:   . Frequency of Communication with Friends and Family: Not on file  . Frequency of Social Gatherings with Friends and Family: Not on file  . Attends Religious Services: Not on file  . Active Member of Clubs or Organizations: Not on file  . Attends Archivist Meetings: Not on file  . Marital Status: Not on file  Intimate Partner Violence:   . Fear of Current or Ex-Partner: Not on file  . Emotionally Abused: Not on file  . Physically Abused: Not on file  . Sexually Abused: Not on file    Outpatient Medications Prior to Visit  Medication Sig Dispense Refill  . benztropine (COGENTIN) 0.5 MG tablet Take 1 tablet (0.5 mg total) by mouth 2 (two) times daily. 60 tablet 2  . Melatonin 3 MG CAPS Take 1 capsule by mouth at bedtime as needed.    . risperiDONE (RISPERDAL) 4 MG tablet Take 1 tablet (4 mg total) by mouth at bedtime. 30 tablet 2   No facility-administered medications prior to visit.    No  Known Allergies  ROS Review of Systems Per hpi    Objective:    Physical Exam Vitals and nursing note reviewed.  Constitutional:      General: She is not in acute distress.    Appearance: Normal appearance. She is normal weight. She is not ill-appearing, toxic-appearing or diaphoretic.  Cardiovascular:     Rate and Rhythm: Normal rate and regular rhythm.     Heart sounds: Normal heart sounds. No murmur heard.  No friction rub. No gallop.   Pulmonary:     Effort: Pulmonary effort is normal. No respiratory distress.     Breath sounds: Normal breath sounds. No stridor. No wheezing, rhonchi or rales.  Chest:     Chest wall: No tenderness.  Skin:    General: Skin is warm and dry.  Neurological:     General: No focal deficit present.      Mental Status: She is alert and oriented to person, place, and time. Mental status is at baseline.  Psychiatric:        Mood and Affect: Mood normal.        Behavior: Behavior normal.        Thought Content: Thought content normal.        Judgment: Judgment normal.     There were no vitals taken for this visit. Wt Readings from Last 3 Encounters:  10/12/18 113 lb 15.7 oz (51.7 kg)  09/01/18 116 lb (52.6 kg)  12/02/17 121 lb 12.8 oz (55.2 kg)     Health Maintenance Due  Topic Date Due  . Hepatitis C Screening  Never done  . HIV Screening  Never done  . TETANUS/TDAP  Never done  . INFLUENZA VACCINE  Never done    There are no preventive care reminders to display for this patient.  Lab Results  Component Value Date   TSH 1.907 03/26/2019   Lab Results  Component Value Date   WBC 5.8 03/26/2019   HGB 11.5 (L) 03/26/2019   HCT 36.1 03/26/2019   MCV 83.2 03/26/2019   PLT 217 03/26/2019   Lab Results  Component Value Date   NA 139 03/26/2019   K 3.6 03/26/2019   CO2 23 03/26/2019   GLUCOSE 96 03/26/2019   BUN 8 03/26/2019   CREATININE 0.56 03/26/2019   BILITOT 0.4 10/06/2018   ALKPHOS 53 10/06/2018   AST 23 10/06/2018   ALT 16 10/06/2018   PROT 7.1 10/06/2018   ALBUMIN 3.9 10/06/2018   CALCIUM 8.7 (L) 03/26/2019   ANIONGAP 10 03/26/2019   Lab Results  Component Value Date   CHOL 160 03/26/2019   Lab Results  Component Value Date   HDL 67 03/26/2019   Lab Results  Component Value Date   LDLCALC 84 03/26/2019   Lab Results  Component Value Date   TRIG 43 03/26/2019   Lab Results  Component Value Date   CHOLHDL 2.4 03/26/2019   Lab Results  Component Value Date   HGBA1C 5.4 03/26/2019      Assessment & Plan:   Problem List Items Addressed This Visit    None    Visit Diagnoses    Syphilis    -  Primary   Relevant Medications   penicillin g benzathine (BICILLIN LA) 1200000 UNIT/2ML injection 2.4 Million Units      Meds ordered this  encounter  Medications  . penicillin g benzathine (BICILLIN LA) 1200000 UNIT/2ML injection 2.4 Million Units    Order Specific Question:   Antibiotic Indication:  Answer:   Syphilis    Follow-up: No follow-ups on file.   PLAN  Pt given penicillin b benzathine inj 2.28M units in clinic today  Encourage repeat testing in 3 mo  Partner here today - also being treated  Encourage friend to get tested  Patient encouraged to call clinic with any questions, comments, or concerns.  Maximiano Coss, NP

## 2020-06-11 ENCOUNTER — Ambulatory Visit: Payer: Self-pay | Admitting: Family Medicine

## 2020-06-26 ENCOUNTER — Inpatient Hospital Stay (HOSPITAL_COMMUNITY): Payer: Self-pay

## 2020-06-26 ENCOUNTER — Inpatient Hospital Stay (HOSPITAL_COMMUNITY)
Admission: AD | Admit: 2020-06-26 | Discharge: 2020-06-26 | Disposition: A | Discharge status: Home / Self Care | Payer: Self-pay | Attending: Obstetrics and Gynecology | Admitting: Obstetrics and Gynecology

## 2020-06-26 ENCOUNTER — Other Ambulatory Visit: Payer: Self-pay

## 2020-06-26 ENCOUNTER — Encounter (HOSPITAL_COMMUNITY): Payer: Self-pay | Admitting: Obstetrics and Gynecology

## 2020-06-26 DIAGNOSIS — O209 Hemorrhage in early pregnancy, unspecified: Secondary | ICD-10-CM | POA: Insufficient documentation

## 2020-06-26 DIAGNOSIS — Z6790 Unspecified blood type, Rh positive: Secondary | ICD-10-CM

## 2020-06-26 DIAGNOSIS — O4690 Antepartum hemorrhage, unspecified, unspecified trimester: Secondary | ICD-10-CM

## 2020-06-26 DIAGNOSIS — O4691 Antepartum hemorrhage, unspecified, first trimester: Secondary | ICD-10-CM

## 2020-06-26 DIAGNOSIS — Z3A01 Less than 8 weeks gestation of pregnancy: Secondary | ICD-10-CM | POA: Insufficient documentation

## 2020-06-26 DIAGNOSIS — O3680X0 Pregnancy with inconclusive fetal viability, not applicable or unspecified: Secondary | ICD-10-CM

## 2020-06-26 LAB — CBC
HCT: 36.8 % (ref 36.0–46.0)
Hemoglobin: 11.4 g/dL — ABNORMAL LOW (ref 12.0–15.0)
MCH: 25.2 pg — ABNORMAL LOW (ref 26.0–34.0)
MCHC: 31 g/dL (ref 30.0–36.0)
MCV: 81.4 fL (ref 80.0–100.0)
Platelets: 235 10*3/uL (ref 150–400)
RBC: 4.52 MIL/uL (ref 3.87–5.11)
RDW: 16.1 % — ABNORMAL HIGH (ref 11.5–15.5)
WBC: 6.5 10*3/uL (ref 4.0–10.5)
nRBC: 0 % (ref 0.0–0.2)

## 2020-06-26 LAB — URINALYSIS, ROUTINE W REFLEX MICROSCOPIC
Bilirubin Urine: NEGATIVE
Glucose, UA: NEGATIVE mg/dL
Ketones, ur: NEGATIVE mg/dL
Leukocytes,Ua: NEGATIVE
Nitrite: NEGATIVE
Protein, ur: NEGATIVE mg/dL
Specific Gravity, Urine: 1.015 (ref 1.005–1.030)
pH: 5 (ref 5.0–8.0)

## 2020-06-26 LAB — COMPREHENSIVE METABOLIC PANEL
ALT: 17 U/L (ref 0–44)
AST: 19 U/L (ref 15–41)
Albumin: 3.5 g/dL (ref 3.5–5.0)
Alkaline Phosphatase: 57 U/L (ref 38–126)
Anion gap: 4 — ABNORMAL LOW (ref 5–15)
BUN: 7 mg/dL (ref 6–20)
CO2: 25 mmol/L (ref 22–32)
Calcium: 8.6 mg/dL — ABNORMAL LOW (ref 8.9–10.3)
Chloride: 108 mmol/L (ref 98–111)
Creatinine, Ser: 0.5 mg/dL (ref 0.44–1.00)
GFR, Estimated: >60 mL/min (ref >60)
Glucose, Bld: 103 mg/dL — ABNORMAL HIGH (ref 70–99)
Potassium: 4.5 mmol/L (ref 3.5–5.1)
Sodium: 137 mmol/L (ref 135–145)
Total Bilirubin: 0.5 mg/dL (ref 0.3–1.2)
Total Protein: 6.1 g/dL — ABNORMAL LOW (ref 6.5–8.1)

## 2020-06-26 LAB — ABO/RH: ABO/RH(D): O POS

## 2020-06-26 LAB — HCG, QUANTITATIVE, PREGNANCY: hCG, Beta Chain, Quant, S: 81 m[IU]/mL — ABNORMAL HIGH (ref <5)

## 2020-06-26 LAB — WET PREP, GENITAL
Sperm: NONE SEEN
Trich, Wet Prep: NONE SEEN
Yeast Wet Prep HPF POC: NONE SEEN

## 2020-06-26 LAB — POCT PREGNANCY, URINE: Preg Test, Ur: POSITIVE — AB

## 2020-06-26 NOTE — MAU Note (Signed)
. °  Jacqueline [name] is a 24 y.o. at [redacted]w[redacted]d here in MAU reporting:she had a positive pregnancy test yesterday. Started having small amount of vaginal bleeding as the day went on and the pain radiates to her right shoulder.  LMP: 05/12/2020 Onset of complaint:yesterday Pain score: 4 Vitals:   06/26/20 1149  BP: 107/65  Pulse: 84  Resp: 16  Temp: 97.8 F (36.6 C)  SpO2: 100%     FHT: Lab orders placed from triage: UA/UPT

## 2020-06-26 NOTE — MAU Provider Note (Signed)
History     CSN: [id number]  Arrival date and time: 06/26/20 1107   First Provider Initiated Contact with Patient 06/26/20 1410      Chief Complaint  Patient presents with  . Vaginal Bleeding  . Abdominal Pain   [name] is a 24 y.o. G1P0000 at [redacted]w[redacted]d who presents to MAU for vaginal bleeding which began yesterday and patient describes as spotting when wiping.  Passing blood clots? no Blood soaking clothes? no Lightheaded/dizzy? no Significant pelvic pain or cramping? Mild cramping, right side pelvis and low back Passed any tissue? no  Current pregnancy problems? Pt has not yet been seen Blood Type? unknown Allergies? NKDA Current PNC & next appt? Pt requests list of OB providers  Pt denies vaginal discharge/odor/itching. Pt denies N/V, abdominal pain, constipation, diarrhea, or urinary problems. Pt denies fever, chills, fatigue, sweating or changes in appetite. Pt denies SOB or chest pain. Pt denies dizziness, HA, light-headedness, weakness.   OB History    Gravida  1   Para  0   Term  0   Preterm  0   AB  0   Living  0     SAB  0   TAB  0   Ectopic  0   Multiple  0   Live Births  0           Past Medical History:  Diagnosis Date  . ADHD     Past Surgical History:  Procedure Laterality Date  . BREAST LUMPECTOMY WITH RADIOACTIVE SEED LOCALIZATION Right 10/12/2018   Procedure: RIGHT BREAST LUMPECTOMY WITH RADIOACTIVE SEED LOCALIZATION;  Surgeon: Fanny Skates, MD;  Location: [facility];  Service: General;  Laterality: Right;  . DENTAL SURGERY      History reviewed. No pertinent family history.  Social History   Tobacco Use  . Smoking status: Never Smoker  . Smokeless tobacco: Never Used  Vaping Use  . Vaping Use: Former  Substance Use Topics  . Alcohol use: Yes    Alcohol/week: 1.0 standard drink    Types: 1 Glasses of wine per week    Comment: once a month  . Drug use: No    Allergies: No Known  Allergies  Medications Prior to Admission  Medication Sig Dispense Refill Last Dose  . benztropine (COGENTIN) 0.5 MG tablet Take 1 tablet (0.5 mg total) by mouth 2 (two) times daily. 60 tablet 2   . Melatonin 3 MG CAPS Take 1 capsule by mouth at bedtime as needed.     . risperiDONE (RISPERDAL) 4 MG tablet Take 1 tablet (4 mg total) by mouth at bedtime. 30 tablet 2     Review of Systems  Constitutional: Negative for chills, diaphoresis, fatigue and fever.  Eyes: Negative for visual disturbance.  Respiratory: Negative for shortness of breath.   Cardiovascular: Negative for chest pain.  Gastrointestinal: Negative for abdominal pain, constipation, diarrhea, nausea and vomiting.  Genitourinary: Negative for dysuria, flank pain, frequency, pelvic pain, urgency, vaginal bleeding and vaginal discharge.  Neurological: Negative for dizziness, weakness, light-headedness and headaches.   Physical Exam   Blood pressure 107/65, pulse 84, temperature 97.8 F (36.6 C), resp. rate 16, height 5\' 1"  (1.549 m), weight 63.5 kg, last menstrual period 05/12/2020, SpO2 100 %.  Patient Vitals for the past 24 hrs:  BP Temp Pulse Resp SpO2 Height Weight  06/26/20 1149 107/65 97.8 F (36.6 C) 84 16 100 % 5\' 1"  (1.549 m) 63.5 kg   Physical Exam Vitals and  nursing note reviewed.  Constitutional:      General: She is not in acute distress.    Appearance: Normal appearance. She is not ill-appearing, toxic-appearing or diaphoretic.  HENT:     Head: Normocephalic and atraumatic.  Pulmonary:     Effort: Pulmonary effort is normal.  Neurological:     Mental Status: She is alert and oriented to person, place, and time.  Psychiatric:        Mood and Affect: Mood normal.        Behavior: Behavior normal.        Thought Content: Thought content normal.        Judgment: Judgment normal.    Results for orders placed or performed during the hospital encounter of 06/26/20 (from the past 24 hour(s))  Pregnancy,  urine POC     Status: Abnormal   Collection Time: 06/26/20 11:18 AM  Result Value Ref Range   Preg Test, Ur POSITIVE (A) NEGATIVE  Urinalysis, Routine w reflex microscopic Urine, Clean Catch     Status: Abnormal   Collection Time: 06/26/20 11:32 AM  Result Value Ref Range   Color, Urine YELLOW YELLOW   APPearance CLEAR CLEAR   Specific Gravity, Urine 1.015 1.005 - 1.030   pH 5.0 5.0 - 8.0   Glucose, UA NEGATIVE NEGATIVE mg/dL   Hgb urine dipstick MODERATE (A) NEGATIVE   Bilirubin Urine NEGATIVE NEGATIVE   Ketones, ur NEGATIVE NEGATIVE mg/dL   Protein, ur NEGATIVE NEGATIVE mg/dL   Nitrite NEGATIVE NEGATIVE   Leukocytes,Ua NEGATIVE NEGATIVE   RBC / HPF 0-5 0 - 5 RBC/hpf   WBC, UA 0-5 0 - 5 WBC/hpf   Bacteria, UA RARE (A) NONE SEEN   Squamous Epithelial / LPF 0-5 0 - 5   Mucus PRESENT   CBC     Status: Abnormal   Collection Time: 06/26/20 12:27 PM  Result Value Ref Range   WBC 6.5 4.0 - 10.5 K/uL   RBC 4.52 3.87 - 5.11 MIL/uL   Hemoglobin 11.4 (L) 12.0 - 15.0 g/dL   HCT 36.8 36 - 46 %   MCV 81.4 80.0 - 100.0 fL   MCH 25.2 (L) 26.0 - 34.0 pg   MCHC 31.0 30.0 - 36.0 g/dL   RDW 16.1 (H) 11.5 - 15.5 %   Platelets 235 150 - 400 K/uL   nRBC 0.0 0.0 - 0.2 %  Comprehensive metabolic panel     Status: Abnormal   Collection Time: 06/26/20 12:27 PM  Result Value Ref Range   Sodium 137 135 - 145 mmol/L   Potassium 4.5 3.5 - 5.1 mmol/L   Chloride 108 98 - 111 mmol/L   CO2 25 22 - 32 mmol/L   Glucose, Bld 103 (H) 70 - 99 mg/dL   BUN 7 6 - 20 mg/dL   Creatinine, Ser 0.50 0.44 - 1.00 mg/dL   Calcium 8.6 (L) 8.9 - 10.3 mg/dL   Total Protein 6.1 (L) 6.5 - 8.1 g/dL   Albumin 3.5 3.5 - 5.0 g/dL   AST 19 15 - 41 U/L   ALT 17 0 - 44 U/L   Alkaline Phosphatase 57 38 - 126 U/L   Total Bilirubin 0.5 0.3 - 1.2 mg/dL   GFR, Estimated >60 >60 mL/min   Anion gap 4 (L) 5 - 15  hCG, quantitative, pregnancy     Status: Abnormal   Collection Time: 06/26/20 12:27 PM  Result Value Ref Range    hCG, Beta Chain, Quant, S 81 (H) <  5 mIU/mL  ABO/Rh     Status: None   Collection Time: 06/26/20 12:27 PM  Result Value Ref Range   ABO/RH(D) O POS    No rh immune globuloin      NOT A RH IMMUNE GLOBULIN CANDIDATE, PT RH POSITIVE Performed at [facility] [address], [place], [place] [address]   Wet prep, genital     Status: Abnormal   Collection Time: 06/26/20 12:38 PM  Result Value Ref Range   Yeast Wet Prep HPF POC NONE SEEN NONE SEEN   Trich, Wet Prep NONE SEEN NONE SEEN   Clue Cells Wet Prep HPF POC PRESENT (A) NONE SEEN   WBC, Wet Prep HPF POC MANY (A) NONE SEEN   Sperm NONE SEEN    US OB LESS THAN 14 WEEKS WITH OB TRANSVAGINAL  Result Date: 06/26/2020 CLINICAL DATA:  Vaginal bleeding with positive beta HCG EXAM: OBSTETRIC <14 WK [place] AND TRANSVAGINAL OB US TECHNIQUE: Both transabdominal and transvaginal ultrasound examinations were performed for complete evaluation of the gestation as well as the maternal uterus, adnexal regions, and pelvic cul-de-sac. Transvaginal technique was performed to assess early pregnancy. COMPARISON:  None. FINDINGS: Intrauterine gestational sac: Not visualized Yolk sac:  Not visualized Embryo:  Not visualized Cardiac Activity: Not visualized Subchorionic hemorrhage:  None visualized. Maternal uterus/adnexae: Cervical os is closed. No intrauterine mass. No endometrial thickening. Right ovary measures 2.8 x 1.7 x 2.1 cm. Left ovary measures 2.7 x 2.1 x 2.2 cm. No extrauterine pelvic mass. No free pelvic fluid. IMPRESSION: No intrauterine gestation evident. Given positive beta HCG value, differential considerations must include intrauterine gestation too early to be seen by either transabdominal or transvaginal technique; recent spontaneous abortion; possible ectopic gestation. This circumstance warrants close clinical and laboratory assessment. Timing of repeat ultrasound in large part will depend on beta HCG values going forward. No abnormality  appreciable on this study. Electronically Signed   By: Lowella Grip III M.D.   On: 06/26/2020 13:53    MAU Course  Procedures  MDM -r/o ectopic -UA: mod hgb/rare bacteria, sending urine for culture -CBC: WNL -CMP: WNL -[place]: PUL -hCG: 81 -ABO: O Positive -WetPrep: +ClueCells (isolated finding not requiring treatment) -GC/CT collected Discussed with client the diagnosis of pregnancy of unknown anatomic location.  Three possibilities of outcome are: a healthy pregnancy that is too early to see a yolk sac to confirm the pregnancy is in the uterus, a pregnancy that is not healthy and has not developed and will not develop, and an ectopic pregnancy that is in the abdomen that cannot be identified at this time.  And ectopic pregnancy can be a life threatening situation as a pregnancy needs to be in the uterus which is a muscle and can stretch to accommodate the growth of a pregnancy.  Other structures in the pelvis and abdomen as not muscular and do not stretch with the growth of a pregnancy.  Worst case scenario is that a structure ruptures with a growing pregnancy not in the uterus and and internal hemorrhage can be a life threatening situation.  We need to follow the progression of this pregnancy carefully.  We need to check another serum pregnancy hormone level to determine if the levels are rising appropriately  and to determine the next steps that are needed for you. Patient's questions were answered. -pt discharged to home in stable condition  Orders Placed This Encounter  Procedures  . Wet prep, genital    Standing Status:  Standing    Number of Occurrences:   1  . Culture, OB Urine    Standing Status:   Standing    Number of Occurrences:   1  . US OB LESS THAN 14 WEEKS WITH OB TRANSVAGINAL    Standing Status:   Standing    Number of Occurrences:   1    Order Specific Question:   Symptom/Reason for Exam    Answer:   Vaginal bleeding in pregnancy [[id number]]  . Urinalysis, Routine w  reflex microscopic Urine, Clean Catch    Standing Status:   Standing    Number of Occurrences:   1  . CBC    Standing Status:   Standing    Number of Occurrences:   1  . Comprehensive metabolic panel    Standing Status:   Standing    Number of Occurrences:   1  . hCG, quantitative, pregnancy    Standing Status:   Standing    Number of Occurrences:   1  . Pregnancy, urine POC    Standing Status:   Standing    Number of Occurrences:   1  . ABO/Rh    Standing Status:   Standing    Number of Occurrences:   1  . Discharge patient    Order Specific Question:   Discharge disposition    Answer:   01-Home or Self Care [1]    Order Specific Question:   Discharge patient date    Answer:   06/26/2020   No orders of the defined types were placed in this encounter.   Assessment and Plan   1. Pregnancy of unknown anatomic location   2. Vaginal bleeding in pregnancy   3. Blood type, Rh positive     Allergies as of 06/26/2020   No Known Allergies     Medication List    TAKE these medications   benztropine 0.5 MG tablet Commonly known as: COGENTIN Take 1 tablet (0.5 mg total) by mouth 2 (two) times daily.   Melatonin 3 MG Caps Take 1 capsule by mouth at bedtime as needed.   risperidone 4 MG tablet Commonly known as: RISPERDAL Take 1 tablet (4 mg total) by mouth at bedtime.       -will call with culture results, if positive -safe meds in pregnancy list given -list of OB providers given -discussed ectopic vs. SAB vs. miscarriage -strict ectopic precautions given -return MAU precautions -f/u on 06/28/2020 at 1230PM at MAU for repeat hCG -pt discharged to home in stable condition  Elmyra Ricks E Ted Leonhart 06/26/2020, 2:48 PM

## 2020-06-26 NOTE — Discharge Instructions (Signed)
Ectopic Pregnancy  An ectopic pregnancy is when the fertilized egg attaches (implants) outside the uterus. Most ectopic pregnancies occur in one of the tubes where eggs travel from the ovary to the uterus (fallopian tubes), but the implanting can occur in other locations. In rare cases, ectopic pregnancies occur on the ovary, intestine, pelvis, abdomen, or cervix. In an ectopic pregnancy, the fertilized egg does not have the ability to develop into a normal, healthy baby. A ruptured ectopic pregnancy is one in which tearing or bursting of a fallopian tube causes internal bleeding. Often, there is intense lower abdominal pain, and vaginal bleeding sometimes occurs. Having an ectopic pregnancy can be life-threatening. If this dangerous condition is not treated, it can lead to blood loss, shock, or even death. What are the causes? The most common cause of this condition is damage to one of the fallopian tubes. A fallopian tube may be narrowed or blocked, and that keeps the fertilized egg from reaching the uterus. What increases the risk? This condition is more likely to develop in women of childbearing age who have different levels of risk. The levels of risk can be divided into three categories. High risk  You have gone through infertility treatment.  You have had an ectopic pregnancy before.  You have had surgery on the fallopian tubes, or another surgical procedure, such as an abortion.  You have had surgery to have the fallopian tubes tied (tubal ligation).  You have problems or diseases of the fallopian tubes.  You have been exposed to diethylstilbestrol (DES). This medicine was used until 1971, and it had effects on babies whose mothers took the medicine.  You become pregnant while using an IUD (intrauterine device) for birth control. Moderate risk  You have a history of infertility.  You have had an STI (sexually transmitted infection).  You have a history of pelvic inflammatory  disease (PID).  You have scarring from endometriosis.  You have multiple sexual partners.  You smoke. Low risk  You have had pelvic surgery.  You use vaginal douches.  You became sexually active before age 11. What are the signs or symptoms? Common symptoms of this condition include normal pregnancy symptoms, such as missing a period, nausea, tiredness, abdominal pain, breast tenderness, and bleeding. However, ectopic pregnancy will have additional symptoms, such as:  Pain with intercourse.  Irregular vaginal bleeding or spotting.  Cramping or pain on one side or in the lower abdomen.  Fast heartbeat, low blood pressure, and sweating.  Passing out while having a bowel movement. Symptoms of a ruptured ectopic pregnancy and internal bleeding may include:  Sudden, severe pain in the abdomen and pelvis.  Dizziness, weakness, light-headedness, or fainting.  Pain in the shoulder or neck area. How is this diagnosed? This condition is diagnosed by:  A pelvic exam to locate pain or a mass in the abdomen.  A pregnancy test. This blood test checks for the presence as well as the specific level of pregnancy hormone in the bloodstream.  Ultrasound. This is performed if a pregnancy test is positive. In this test, a probe is inserted into the vagina. The probe will detect a fetus, possibly in a location other than the uterus.  Taking a sample of uterus tissue (dilation and curettage, or D&C).  Surgery to perform a visual exam of the inside of the abdomen using a thin, lighted tube that has a tiny camera on the end (laparoscope).  Culdocentesis. This procedure involves inserting a needle at the top of  the vagina, behind the uterus. If blood is present in this area, it may indicate that a fallopian tube is torn. How is this treated? This condition is treated with medicine or surgery. Medicine  An injection of a medicine (methotrexate) may be given to cause the pregnancy tissue to be  absorbed. This medicine may save your fallopian tube. It may be given if: ? The diagnosis is made early, with no signs of active bleeding. ? The fallopian tube has not ruptured. ? You are considered to be a good candidate for the medicine. Usually, pregnancy hormone blood levels are checked after methotrexate treatment. This is to be sure that the medicine is effective. It may take 4-6 weeks for the pregnancy to be absorbed. Most pregnancies will be absorbed by 3 weeks. Surgery  A laparoscope may be used to remove the pregnancy tissue.  If severe internal bleeding occurs, a larger cut (incision) may be made in the lower abdomen (laparotomy) to remove the fetus and placenta. This is done to stop the bleeding.  Part or all of the fallopian tube may be removed (salpingectomy) along with the fetus and placenta. The fallopian tube may also be repaired during the surgery.  In very rare circumstances, removal of the uterus (hysterectomy) may be required.  After surgery, pregnancy hormone testing may be done to be sure that there is no pregnancy tissue left. Whether your treatment is medicine or surgery, you may receive a Rho (D) immune globulin shot to prevent problems with any future pregnancy. This shot may be given if:  You are Rh-negative and the baby's father is Rh-positive.  You are Rh-negative and you do not know the Rh type of the baby's father. Follow these instructions at home:  Rest and limit your activity after the procedure for as long as told by your health care provider.  Until your health care provider says that it is safe: ? Do not lift anything that is heavier than 10 lb (4.5 kg), or the limit that your health care provider tells you. ? Avoid physical exercise and any movement that requires effort (is strenuous).  To help prevent constipation: ? Eat a healthy diet that includes fruits, vegetables, and whole grains. ? Drink 6-8 glasses of water per day. Get help right away  if:  You develop worsening pain that is not relieved by medicine.  You have: ? A fever or chills. ? Vaginal bleeding. ? Redness and swelling at the incision site. ? Nausea and vomiting.  You feel dizzy or weak.  You feel light-headed or you faint. This information is not intended to replace advice given to you by your health care provider. Make sure you discuss any questions you have with your health care provider. Document Revised: 07/16/2017 Document Reviewed: 03/04/2016 Elsevier Patient Education  [place] of Pregnancy The first trimester of pregnancy is from week 1 until the end of week 13 (months 1 through 3). A week after a sperm fertilizes an egg, the egg will implant on the wall of the uterus. This embryo will begin to develop into a baby. Genes from you and your partner will form the baby. The female genes will determine whether the baby will be a boy or a girl. At 6-8 weeks, the eyes and face will be formed, and the heartbeat can be seen on ultrasound. At the end of 12 weeks, all the baby's organs will be formed. Now that you are  pregnant, you will want to do everything you can to have a healthy baby. Two of the most important things are to get good prenatal care and to follow your health care provider's instructions. Prenatal care is all the medical care you receive before the baby's birth. This care will help prevent, find, and treat any problems during the pregnancy and childbirth. Body changes during your first trimester Your body goes through many changes during pregnancy. The changes vary from woman to woman.  You may gain or lose a couple of pounds at first.  You may feel sick to your stomach (nauseous) and you may throw up (vomit). If the vomiting is uncontrollable, call your health care provider.  You may tire easily.  You may develop headaches that can be relieved by medicines. All medicines should be approved by your health care  provider.  You may urinate more often. Painful urination may mean you have a bladder infection.  You may develop heartburn as a result of your pregnancy.  You may develop constipation because certain hormones are causing the muscles that push stool through your intestines to slow down.  You may develop hemorrhoids or swollen veins (varicose veins).  Your breasts may begin to grow larger and become tender. Your nipples may stick out more, and the tissue that surrounds them (areola) may become darker.  Your gums may bleed and may be sensitive to brushing and flossing.  Dark spots or blotches (chloasma, mask of pregnancy) may develop on your face. This will likely fade after the baby is born.  Your menstrual periods will stop.  You may have a loss of appetite.  You may develop cravings for certain kinds of food.  You may have changes in your emotions from day to day, such as being excited to be pregnant or being concerned that something may go wrong with the pregnancy and baby.  You may have more vivid and strange dreams.  You may have changes in your hair. These can include thickening of your hair, rapid growth, and changes in texture. Some women also have hair loss during or after pregnancy, or hair that feels dry or thin. Your hair will most likely return to normal after your baby is born. What to expect at prenatal visits During a routine prenatal visit:  You will be weighed to make sure you and the baby are growing normally.  Your blood pressure will be taken.  Your abdomen will be measured to track your baby's growth.  The fetal heartbeat will be listened to between weeks 10 and 14 of your pregnancy.  Test results from any previous visits will be discussed. Your health care provider may ask you:  How you are feeling.  If you are feeling the baby move.  If you have had any abnormal symptoms, such as leaking fluid, bleeding, severe headaches, or abdominal cramping.  If  you are using any tobacco products, including cigarettes, chewing tobacco, and electronic cigarettes.  If you have any questions. Other tests that may be performed during your first trimester include:  Blood tests to find your blood type and to check for the presence of any previous infections. The tests will also be used to check for low iron levels (anemia) and protein on red blood cells (Rh antibodies). Depending on your risk factors, or if you previously had diabetes during pregnancy, you may have tests to check for high blood sugar that affects pregnant women (gestational diabetes).  Urine tests to check for infections, diabetes,  or protein in the urine.  An ultrasound to confirm the proper growth and development of the baby.  Fetal screens for spinal cord problems (spina bifida) and Down syndrome.  HIV (human immunodeficiency virus) testing. Routine prenatal testing includes screening for HIV, unless you choose not to have this test.  You may need other tests to make sure you and the baby are doing well. Follow these instructions at home: Medicines  Follow your health care provider's instructions regarding medicine use. Specific medicines may be either safe or unsafe to take during pregnancy.  Take a prenatal vitamin that contains at least 600 micrograms (mcg) of folic acid.  If you develop constipation, try taking a stool softener if your health care provider approves. Eating and drinking   Eat a balanced diet that includes fresh fruits and vegetables, whole grains, good sources of protein such as meat, eggs, or tofu, and low-fat dairy. Your health care provider will help you determine the amount of weight gain that is right for you.  Avoid raw meat and uncooked cheese. These carry germs that can cause birth defects in the baby.  Eating four or five small meals rather than three large meals a day may help relieve nausea and vomiting. If you start to feel nauseous, eating a few  soda crackers can be helpful. Drinking liquids between meals, instead of during meals, also seems to help ease nausea and vomiting.  Limit foods that are high in fat and processed sugars, such as fried and sweet foods.  To prevent constipation: ? Eat foods that are high in fiber, such as fresh fruits and vegetables, whole grains, and beans. ? Drink enough fluid to keep your urine clear or pale yellow. Activity  Exercise only as directed by your health care provider. Most women can continue their usual exercise routine during pregnancy. Try to exercise for 30 minutes at least 5 days a week. Exercising will help you: ? Control your weight. ? Stay in shape. ? Be prepared for labor and delivery.  Experiencing pain or cramping in the lower abdomen or lower back is a good sign that you should stop exercising. Check with your health care provider before continuing with normal exercises.  Try to avoid standing for long periods of time. Move your legs often if you must stand in one place for a long time.  Avoid heavy lifting.  Wear low-heeled shoes and practice good posture.  You may continue to have sex unless your health care provider tells you not to. Relieving pain and discomfort  Wear a good support bra to relieve breast tenderness.  Take warm sitz baths to soothe any pain or discomfort caused by hemorrhoids. Use hemorrhoid cream if your health care provider approves.  Rest with your legs elevated if you have leg cramps or low back pain.  If you develop varicose veins in your legs, wear support hose. Elevate your feet for 15 minutes, 3-4 times a day. Limit salt in your diet. Prenatal care  Schedule your prenatal visits by the twelfth week of pregnancy. They are usually scheduled monthly at first, then more often in the last 2 months before delivery.  Write down your questions. Take them to your prenatal visits.  Keep all your prenatal visits as told by your health care provider.  This is important. Safety  Wear your seat belt at all times when driving.  Make a list of emergency phone numbers, including numbers for family, friends, the hospital, and police and fire departments. General  instructions  Ask your health care provider for a referral to a local prenatal education class. Begin classes no later than the beginning of month 6 of your pregnancy.  Ask for help if you have counseling or nutritional needs during pregnancy. Your health care provider can offer advice or refer you to specialists for help with various needs.  Do not use hot tubs, steam rooms, or saunas.  Do not douche or use tampons or scented sanitary pads.  Do not cross your legs for long periods of time.  Avoid cat litter boxes and soil used by cats. These carry germs that can cause birth defects in the baby and possibly loss of the fetus by miscarriage or stillbirth.  Avoid all smoking, herbs, alcohol, and medicines not prescribed by your health care provider. Chemicals in these products affect the formation and growth of the baby.  Do not use any products that contain nicotine or tobacco, such as cigarettes and e-cigarettes. If you need help quitting, ask your health care provider. You may receive counseling support and other resources to help you quit.  Schedule a dentist appointment. At home, brush your teeth with a soft toothbrush and be gentle when you floss. Contact a health care provider if:  You have dizziness.  You have mild pelvic cramps, pelvic pressure, or nagging pain in the abdominal area.  You have persistent nausea, vomiting, or diarrhea.  You have a bad smelling vaginal discharge.  You have pain when you urinate.  You notice increased swelling in your face, hands, legs, or ankles.  You are exposed to fifth disease or chickenpox.  You are exposed to [place] measles (rubella) and have never had it. Get help right away if:  You have a fever.  You are leaking fluid  from your vagina.  You have spotting or bleeding from your vagina.  You have severe abdominal cramping or pain.  You have rapid weight gain or loss.  You vomit blood or material that looks like coffee grounds.  You develop a severe headache.  You have shortness of breath.  You have any kind of trauma, such as from a fall or a car accident. Summary  The first trimester of pregnancy is from week 1 until the end of week 13 (months 1 through 3).  Your body goes through many changes during pregnancy. The changes vary from woman to woman.  You will have routine prenatal visits. During those visits, your health care provider will examine you, discuss any test results you may have, and talk with you about how you are feeling. This information is not intended to replace advice given to you by your health care provider. Make sure you discuss any questions you have with your health care provider. Document Revised: 07/16/2017 Document Reviewed: 07/15/2016 Elsevier Patient Education  2020 [place] A miscarriage is the loss of an unborn baby (fetus) before the 20th week of pregnancy. Most miscarriages happen during the first 3 months of pregnancy. Sometimes, a miscarriage can happen before a woman knows that she is pregnant. Having a miscarriage can be an emotional experience. If you have had a miscarriage, talk with your health care provider about any questions you may have about miscarrying, the grieving process, and your plans for future pregnancy. What are the causes? A miscarriage may be caused by:  Problems with the genes or chromosomes of the fetus. These problems make it impossible for the baby to develop normally. They  are often the result of random errors that occur early in the development of the baby, and are not passed from parent to child (not inherited).  Infection of the cervix or uterus.  Conditions that affect hormone balance in the  body.  Problems with the cervix, such as the cervix opening and thinning before pregnancy is at term (cervical insufficiency).  Problems with the uterus. These may include: ? A uterus with an abnormal shape. ? Fibroids in the uterus. ? Congenital abnormalities. These are problems that were present at birth.  Certain medical conditions.  Smoking, drinking alcohol, or using drugs.  Injury (trauma). In many cases, the cause of a miscarriage is not known. What are the signs or symptoms? Symptoms of this condition include:  Vaginal bleeding or spotting, with or without cramps or pain.  Pain or cramping in the abdomen or lower back.  Passing fluid, tissue, or blood clots from the vagina. How is this diagnosed? This condition may be diagnosed based on:  A physical exam.  Ultrasound.  Blood tests.  Urine tests. How is this treated? Treatment for a miscarriage is sometimes not necessary if you naturally pass all the tissue that was in your uterus. If necessary, this condition may be treated with:  Dilation and curettage (D&C). This is a procedure in which the cervix is stretched open and the lining of the uterus (endometrium) is scraped. This is done only if tissue from the fetus or placenta remains in the body (incomplete miscarriage).  Medicines, such as: ? Antibiotic medicine, to treat infection. ? Medicine to help the body pass any remaining tissue. ? Medicine to reduce (contract) the size of the uterus. These medicines may be given if you have a lot of bleeding. If you have Rh negative blood and your baby was Rh positive, you will need a shot of a medicine called Rh immunoglobulinto protect your future babies from Rh blood problems. "Rh-negative" and "Rh-positive" refer to whether or not the blood has a specific protein found on the surface of red blood cells (Rh factor). Follow these instructions at home: Medicines   Take over-the-counter and prescription medicines only as  told by your health care provider.  If you were prescribed antibiotic medicine, take it as told by your health care provider. Do not stop taking the antibiotic even if you start to feel better.  Do not take NSAIDs, such as aspirin and ibuprofen, unless they are approved by your health care provider. These medicines can cause bleeding. Activity  Rest as directed. Ask your health care provider what activities are safe for you.  Have someone help with home and family responsibilities during this time. General instructions  Keep track of the number of sanitary pads you use each day and how soaked (saturated) they are. Write down this information.  Monitor the amount of tissue or blood clots that you pass from your vagina. Save any large amounts of tissue for your health care provider to examine.  Do not use tampons, douche, or have sex until your health care provider approves.  To help you and your partner with the process of grieving, talk with your health care provider or seek counseling.  When you are ready, meet with your health care provider to discuss any important steps you should take for your health. Also, discuss steps you should take to have a healthy pregnancy in the future.  Keep all follow-up visits as told by your health care provider. This is important. Where to find  more information  The [employer] of Obstetricians and Gynecologists: www.acog.org  [place]S. Department of Health and [employer], systems of Women's Health: [place] Contact a health care provider if:  You have a fever or chills.  You have a foul smelling vaginal discharge.  You have more bleeding instead of less. Get help right away if:  You have severe cramps or pain in your back or abdomen.  You pass blood clots or tissue from your vagina that is walnut-sized or larger.  You soak more than 1 regular sanitary pad in an hour.  You become light-headed or weak.  You pass out.  You  have feelings of sadness that take over your thoughts, or you have thoughts of hurting yourself. Summary  Most miscarriages happen in the first 3 months of pregnancy. Sometimes miscarriage happens before a woman even knows that she is pregnant.  Follow your health care provider's instruction for home care. Keep all follow-up appointments.  To help you and your partner with the process of grieving, talk with your health care provider or seek counseling. This information is not intended to replace advice given to you by your health care provider. Make sure you discuss any questions you have with your health care provider. Document Revised: 11/25/2018 Document Reviewed: 09/08/2016 Elsevier Patient Education  [place] Medications in Pregnancy    Acne: Benzoyl Peroxide Salicylic Acid  Backache/Headache: Tylenol: 2 regular strength every 4 hours OR              2 Extra strength every 6 hours  Colds/Coughs/Allergies: Benadryl (alcohol free) 25 mg every 6 hours as needed Breath right strips Claritin Cepacol throat lozenges Chloraseptic throat spray Cold-Eeze- up to three times per day Cough drops, alcohol free Flonase (by prescription only) Guaifenesin Mucinex Robitussin DM (plain only, alcohol free) Saline nasal spray/drops Sudafed (pseudoephedrine) & Actifed ** use only after [redacted] weeks gestation and if you do not have high blood pressure Tylenol Vicks Vaporub Zinc lozenges Zyrtec   Constipation: Colace Ducolax suppositories Fleet enema Glycerin suppositories Metamucil Milk of magnesia Miralax Senokot Smooth move tea  Diarrhea: Kaopectate Imodium A-D  *NO pepto Bismol  Hemorrhoids: Anusol Anusol HC Preparation H Tucks  Indigestion: Tums Maalox Mylanta Zantac  Pepcid  Insomnia: Benadryl (alcohol free) 25mg  every 6 hours as needed Tylenol PM Unisom, no Gelcaps  Leg Cramps: Tums MagGel  Nausea/Vomiting:   Bonine Dramamine Emetrol Ginger extract Sea bands Meclizine  Nausea medication to take during pregnancy:  Unisom (doxylamine succinate 25 mg tablets) Take one tablet daily at bedtime. If symptoms are not adequately controlled, the dose can be increased to a maximum recommended dose of two tablets daily (1/2 tablet in the morning, 1/2 tablet mid-afternoon and one at bedtime). Vitamin B6 100mg  tablets. Take one tablet twice a day (up to 200 mg per day).  Skin Rashes: Aveeno products Benadryl cream or 25mg  every 6 hours as needed Calamine Lotion 1% cortisone cream  Yeast infection: Gyne-lotrimin 7 Monistat 7   **If taking multiple medications, please check labels to avoid duplicating the same active ingredients **take medication as directed on the label ** Do not exceed 4000 mg of tylenol in 24 hours **Do not take medications that contain aspirin or ibuprofen          Prenatal Care Providers  [facility] [place] [facility] [place] [facility] - accepts patients without insurance  Phone: [place] for [employer] @ [place]   Phone: [place] @[place]       Phone: [place] [facility] [place] @ [place]     Phone: [place] for [employer] @ [employer]   Phone: [place] [facility] [place] [facility] - accepts patients without insurance  Phone: [place] [facility] [place] [facility] Phone: [place] [facility] - accepts patients without insurance Phone: [facility]  Phone: [facility] Phone: [phone]  [facility] Phone: [phone]  [facility] Phone: [phone]  [facility] Phone: [phone]  [facility]  Phone: [phone]

## 2020-06-27 ENCOUNTER — Encounter (HOSPITAL_COMMUNITY): Payer: Self-pay | Admitting: Obstetrics and Gynecology

## 2020-06-27 ENCOUNTER — Other Ambulatory Visit: Payer: Self-pay

## 2020-06-27 ENCOUNTER — Inpatient Hospital Stay (HOSPITAL_COMMUNITY)
Admission: AD | Admit: 2020-06-27 | Discharge: 2020-06-27 | Disposition: A | Discharge status: Home / Self Care | Payer: Self-pay | Attending: Obstetrics and Gynecology | Admitting: Obstetrics and Gynecology

## 2020-06-27 DIAGNOSIS — Z3A01 Less than 8 weeks gestation of pregnancy: Secondary | ICD-10-CM | POA: Insufficient documentation

## 2020-06-27 DIAGNOSIS — O0281 Inappropriate change in quantitative human chorionic gonadotropin (hCG) in early pregnancy: Secondary | ICD-10-CM | POA: Insufficient documentation

## 2020-06-27 DIAGNOSIS — O200 Threatened abortion: Secondary | ICD-10-CM

## 2020-06-27 LAB — CULTURE, OB URINE: Culture: <10000 — AB

## 2020-06-27 LAB — CBC
HCT: 36.4 % (ref 36.0–46.0)
Hemoglobin: 11.3 g/dL — ABNORMAL LOW (ref 12.0–15.0)
MCH: 25.2 pg — ABNORMAL LOW (ref 26.0–34.0)
MCHC: 31 g/dL (ref 30.0–36.0)
MCV: 81.1 fL (ref 80.0–100.0)
Platelets: 233 10*3/uL (ref 150–400)
RBC: 4.49 MIL/uL (ref 3.87–5.11)
RDW: 16 % — ABNORMAL HIGH (ref 11.5–15.5)
WBC: 8.4 10*3/uL (ref 4.0–10.5)
nRBC: 0 % (ref 0.0–0.2)

## 2020-06-27 LAB — GC/CHLAMYDIA PROBE AMP (~~LOC~~) NOT AT ARMC
Chlamydia: NEGATIVE
Comment: NEGATIVE
Comment: NORMAL
Neisseria Gonorrhea: NEGATIVE

## 2020-06-27 LAB — HCG, QUANTITATIVE, PREGNANCY: hCG, Beta Chain, Quant, S: 69 m[IU]/mL — ABNORMAL HIGH (ref <5)

## 2020-06-27 MED ORDER — HYDROCODONE-ACETAMINOPHEN 5-325 MG PO TABS
1.0000 | ORAL_TABLET | Freq: Four times a day (QID) | ORAL | 0 refills | Status: AC | PRN
Start: 2020-06-27 — End: 2020-06-29

## 2020-06-27 NOTE — Discharge Instructions (Signed)
Managing Pregnancy Loss °Pregnancy loss can happen any time during a pregnancy. Often the cause is not known. It is rarely because of anything you did. Pregnancy loss in early pregnancy (during the first trimester) is called a miscarriage. This type of pregnancy loss is the most common. Pregnancy loss that happens after 20 weeks of pregnancy is called fetal demise if the baby's heart stops beating before birth. Fetal demise is much less common. Some women experience spontaneous labor shortly after fetal demise resulting in a stillborn birth (stillbirth). °Any pregnancy loss can be devastating. You will need to recover both physically and emotionally. Most women are able to get pregnant again after a pregnancy loss and deliver a healthy baby. °How to manage emotional recovery ° °Pregnancy loss is very hard emotionally. You may feel many different emotions while you grieve. You may feel sad and angry. You may also feel guilty. It is normal to have periods of crying. Emotional recovery can take longer than physical recovery. It is different for everyone. °Taking these steps can help you in managing this loss: °· Remember that it is unlikely you did anything to cause the pregnancy loss. °· Share your thoughts and feelings with friends, family, and your partner. Remember that your partner is also recovering emotionally. °· Make sure you have a good support system. Do not spend too much time alone. °· Meet with a pregnancy loss counselor or join a pregnancy loss support group. °· Get enough sleep and eat a healthy diet. Return to regular exercise when you have recovered physically. °· Do not use drugs or alcohol to manage your emotions. °· Consider seeing a mental health professional to help you recover emotionally. °· Ask a friend or loved one to help you decide what to do with any clothing and nursery items you received for your baby. °In the case of a stillbirth, many women benefit from taking additional steps in the  grieving process. You may want to: °· Hold your baby after the birth. °· Name your baby. °· Request a birth certificate. °· Create a keepsake such as handprints or footprints. °· Dress your baby and have a picture taken. °· Make funeral arrangements. °· Ask for a baptism or blessing. °Hospitals have staff members who can help you with all these arrangements. °How to recognize emotional stress °It is normal to have emotional stress after a pregnancy loss. But emotional stress that lasts a long time or becomes severe requires treatment. Watch out for these signs of severe emotional stress: °· Sadness, anger, or guilt that is not going away and is interfering with your normal activities. °· Relationship problems that have occurred or gotten worse since the pregnancy loss. °· Signs of depression that last longer than 2 weeks. These may include: °? Sadness. °? Anxiety. °? Hopelessness. °? Loss of interest in activities you enjoy. °? Inability to concentrate. °? Trouble sleeping or sleeping too much. °? Loss of appetite or overeating. °? Thoughts of death or of hurting yourself. °Follow these instructions at home: °· Take over-the-counter and prescription medicines only as told by your health care provider. °· Rest at home until your energy level returns. Return to your normal activities as told by your health care provider. Ask your health care provider what activities are safe for you. °· When you are ready, meet with your health care provider to discuss steps to take for a future pregnancy. °· Keep all follow-up visits as told by your health care provider. This is important. °  Where to find support  To help you and your partner with the process of grieving, talk with your health care provider or seek counseling.  Consider meeting with others who have experienced pregnancy loss. Ask your health care provider about support groups and resources. Where to find more information  U.S. Department of Health and Holiday representative on Women's Health: [place]  American Pregnancy Association: www.americanpregnancy.org Contact a health care provider if:  You continue to experience grief, sadness, or lack of motivation for everyday activities, and those feelings do not improve over time.  You are struggling to recover emotionally, especially if you are using alcohol or substances to help. Get help right away if:  You have thoughts of hurting yourself or others. If you ever feel like you may hurt yourself or others, or have thoughts about taking your own life, get help right away. You can go to your nearest emergency department or call:  Your local emergency services (911 in the [place]  A suicide crisis helpline, such as the [place] at 281-214-1640. This is open 24 hours a day. Summary  Any pregnancy loss can be difficult physically and emotionally.  You may experience many different emotions while you grieve. Emotional recovery can last longer than physical recovery.  It is normal to have emotional stress after a pregnancy loss. But emotional stress that lasts a long time or becomes severe requires treatment.  See your health care provider if you are struggling emotionally after a pregnancy loss. This information is not intended to replace advice given to you by your health care provider. Make sure you discuss any questions you have with your health care provider. Document Revised: 11/23/2018 Document Reviewed: 10/14/2017 Elsevier Patient Education  2020 [employer].

## 2020-06-27 NOTE — MAU Note (Signed)
.   Jacqueline [name] is a 24 y.o. at [redacted]w[redacted]d here in MAU reporting: Increased vaginal bleeding since her visit in MAU yesterday. She reports she started passing quarter-sized clots last night around 2000 and continues to pass them. She also reports that her lower right abdominal pain has increased since yesterday as well.   Pain score: 7 Vitals:   06/27/20 1133  BP: (!) 120/58  Pulse: 85  Resp: 15  Temp: 98.4 F (36.9 C)  SpO2: 100%

## 2020-07-23 ENCOUNTER — Ambulatory Visit (INDEPENDENT_AMBULATORY_CARE_PROVIDER_SITE_OTHER): Payer: Self-pay | Admitting: Family Medicine

## 2020-07-23 ENCOUNTER — Other Ambulatory Visit: Payer: Self-pay

## 2020-07-23 ENCOUNTER — Encounter: Payer: Self-pay | Admitting: Family Medicine

## 2020-07-23 VITALS — BP 111/59 | HR 87 | Wt 146.5 lb

## 2020-07-23 DIAGNOSIS — O3680X0 Pregnancy with inconclusive fetal viability, not applicable or unspecified: Secondary | ICD-10-CM

## 2020-07-23 LAB — BETA HCG QUANT (REF LAB): hCG Quant: 111 m[IU]/mL

## 2020-07-23 NOTE — Progress Notes (Addendum)
GYNECOLOGY OFFICE VISIT NOTE  History:   Jacqueline [name] is a 24 y.o. G1P0000 here today for follow up of SAB.  Patient seen in the MAU on 06/26/2020 for vaginal bleeding Had beta-HCG level of 81, and had unremarkable TVUS with no findings Was told to return to MAU for repeat HCG in two days and had decrease of 81>69  Today reports still having some dizziness and nausea as well as breast tenderness Has still been having positive pregnancy tests Also some back pain which was present before, but improved since miscarriage Had bleeding for two weeks at time of miscarriage Stopped for a week and returned for several days and then went away again No bleeding in the past week Having some bloating but no belly pain   Past Medical History:  Diagnosis Date  . ADHD     Past Surgical History:  Procedure Laterality Date  . BREAST LUMPECTOMY WITH RADIOACTIVE SEED LOCALIZATION Right 10/12/2018   Procedure: RIGHT BREAST LUMPECTOMY WITH RADIOACTIVE SEED LOCALIZATION;  Surgeon: Fanny Skates, MD;  Location: [facility];  Service: General;  Laterality: Right;  . DENTAL SURGERY      The following portions of the patient's history were reviewed and updated as appropriate: allergies, current medications, past family history, past medical history, past social history, past surgical history and problem list.   Health Maintenance:  Normal pap and with +HPV on 09/01/2018, next due in 3 years.  Normal mammogram on: n/a.   Review of Systems:  Pertinent items noted in HPI and remainder of comprehensive ROS otherwise negative.  Physical Exam:  BP (!) 111/59   Pulse 87   Wt 146 lb 8 oz (66.5 kg)   LMP 05/12/2020   Breastfeeding Unknown   BMI 27.68 kg/m  CONSTITUTIONAL: Well-developed, well-nourished female in no acute distress.  HEENT:  Normocephalic, atraumatic. External right and left ear normal. No scleral icterus.  NECK: Normal range of motion, supple, no masses noted on  observation SKIN: No rash noted. Not diaphoretic. No erythema. No pallor. MUSCULOSKELETAL: Normal range of motion. No edema noted. NEUROLOGIC: Alert and oriented to person, place, and time. Normal muscle tone coordination.  PSYCHIATRIC: Normal mood and affect. Normal behavior. Normal judgment and thought content. RESPIRATORY: Effort normal, no problems with respiration noted  PELVIC: Deferred  Labs and Imaging No results found for this or any previous visit (from the past 168 hour(s)). US OB LESS THAN 14 WEEKS WITH OB TRANSVAGINAL  Result Date: 06/26/2020 CLINICAL DATA:  Vaginal bleeding with positive beta HCG EXAM: OBSTETRIC <14 WK [place] AND TRANSVAGINAL OB US TECHNIQUE: Both transabdominal and transvaginal ultrasound examinations were performed for complete evaluation of the gestation as well as the maternal uterus, adnexal regions, and pelvic cul-de-sac. Transvaginal technique was performed to assess early pregnancy. COMPARISON:  None. FINDINGS: Intrauterine gestational sac: Not visualized Yolk sac:  Not visualized Embryo:  Not visualized Cardiac Activity: Not visualized Subchorionic hemorrhage:  None visualized. Maternal uterus/adnexae: Cervical os is closed. No intrauterine mass. No endometrial thickening. Right ovary measures 2.8 x 1.7 x 2.1 cm. Left ovary measures 2.7 x 2.1 x 2.2 cm. No extrauterine pelvic mass. No free pelvic fluid. IMPRESSION: No intrauterine gestation evident. Given positive beta HCG value, differential considerations must include intrauterine gestation too early to be seen by either transabdominal or transvaginal technique; recent spontaneous abortion; possible ectopic gestation. This circumstance warrants close clinical and laboratory assessment. Timing of repeat ultrasound in large part will depend on beta HCG values going forward. No  abnormality appreciable on this study. Electronically Signed   By: Lowella Grip III M.D.   On: 06/26/2020 13:53      Assessment and Plan:    Problem List Items Addressed This Visit      Other   Pregnancy, location unknown    Pregnancy of unknown location at present though likely SAB. HCG downtrending one month ago from 81>69, will send repeat today, weekly follow up if still positive otherwise can follow up PRN. Also discussed contraception, using condoms, does not want hormonal birth control, discussed copper IUD and directed to bedsider.org for more information.   Addendum (07/23/2020 4:56 PM): Patient's hcg returned at 111. Spoke to patient, she has not had intercourse since 06/23/2020 (beta of 81 on 11/10 followed by downtrending level). Discussed with Dr. Elly Modena, possible this represents new pregnancy (unlikely) vs failed pregnancy. Will have patient return in 2 days for stat beta check, if continues to be plateued will likely need methotrexate. Plan discussed with patient, all questions answered.        Other Visit Diagnoses    Pregnancy of unknown anatomic location    -  Primary   Relevant Orders   Beta hCG quant (ref lab) (Completed)      Routine preventative health maintenance measures emphasized. Please refer to After Visit Summary for other counseling recommendations.   Return in about 1 week (around 07/30/2020), or repeat hcg test nurse visit.    Total face-to-face time with patient: 20 minutes.  Over 50% of encounter was spent on counseling and coordination of care.   Augustin Coupe, [place] [facility] [employer], [place]

## 2020-07-23 NOTE — Assessment & Plan Note (Addendum)
Pregnancy of unknown location at present though likely SAB. HCG downtrending one month ago from 81>69, will send repeat today, weekly follow up if still positive otherwise can follow up PRN. Also discussed contraception, using condoms, does not want hormonal birth control, discussed copper IUD and directed to bedsider.org for more information.   Addendum (07/23/2020 4:56 PM): Patient's hcg returned at 111. Spoke to patient, she has not had intercourse since 06/23/2020 (beta of 81 on 11/10 followed by downtrending level). Discussed with Dr. Elly Modena, possible this represents new pregnancy (unlikely) vs failed pregnancy. Will have patient return in 2 days for stat beta check, if continues to be plateued will likely need methotrexate. Plan discussed with patient, all questions answered.

## 2020-07-25 ENCOUNTER — Ambulatory Visit (INDEPENDENT_AMBULATORY_CARE_PROVIDER_SITE_OTHER): Payer: Self-pay | Admitting: General Practice

## 2020-07-25 DIAGNOSIS — O3680X0 Pregnancy with inconclusive fetal viability, not applicable or unspecified: Secondary | ICD-10-CM

## 2020-07-25 LAB — BETA HCG QUANT (REF LAB): hCG Quant: 30 m[IU]/mL

## 2020-07-25 NOTE — Progress Notes (Signed)
Patient presents to office today for stat bhcg following up from office visit on 07/23/20. Patient reports new onset of lower back pain on the right side rated at a 1. Patient denies bleeding. She reports feeling very anxious about results and what is going on. Discussed with patient we are monitoring your bhcg levels today, results take approximately 2 hours to finalize and will be reviewed with a provider, we will then call you results. Patient verbalized understanding.    Reviewed results with Dr Elgie Congo who finds decreasing bhcg reassuring- patient should have follow up bhcg in 1 week.  Called patient & informed her of results and discussed follow up. Patient verbalized understanding and asked if she needed to take pills (cytotec). Told patient no, not at this time. Patient states she already has an appt next Tuesday at 74 and that is the only day she can come. Told patient that is fine & we will follow up with her then. Patient verbalized understanding.  Koren Bound RN BSN 07/25/20

## 2020-07-29 ENCOUNTER — Other Ambulatory Visit: Payer: Self-pay | Admitting: *Deleted

## 2020-07-29 DIAGNOSIS — O3680X0 Pregnancy with inconclusive fetal viability, not applicable or unspecified: Secondary | ICD-10-CM

## 2020-07-29 NOTE — Progress Notes (Signed)
Patient was assessed and managed by nursing staff during this encounter. I have reviewed the chart and agree with the documentation and plan. I have also made any necessary editorial changes.  Griffin Basil, MD 07/29/2020 9:04 AM

## 2020-07-30 ENCOUNTER — Other Ambulatory Visit: Payer: Self-pay

## 2020-07-30 ENCOUNTER — Encounter: Payer: Self-pay | Admitting: *Deleted

## 2020-07-30 ENCOUNTER — Ambulatory Visit (INDEPENDENT_AMBULATORY_CARE_PROVIDER_SITE_OTHER): Payer: Self-pay | Admitting: *Deleted

## 2020-07-30 VITALS — BP 107/67 | HR 78 | Ht 61.0 in | Wt 144.9 lb

## 2020-07-30 DIAGNOSIS — O3680X0 Pregnancy with inconclusive fetal viability, not applicable or unspecified: Secondary | ICD-10-CM

## 2020-07-30 DIAGNOSIS — O200 Threatened abortion: Secondary | ICD-10-CM

## 2020-07-30 NOTE — Progress Notes (Signed)
Pt presents for non-stat BHCG and review of symptoms. She reports that all bleeding had stopped for >1 week and her regular cycle started 2 days ago. She is having the amount of bleeding and cramping which is normal for her. Lab drawn for non-stat BHCG. Pt was advised that she will be able to review her test result in Mychart tomorrow. If the level is >5 she will require additional lab draw in one week. Pt states she is currently using condoms and does not desire other forms of birth control. She also does not desire pregnancy @ this time. Pt stated that she is feeling anxious about all that has happened with this failed pregnancy and had multiple questions. I was able to answer some of her questions and advised need for f/u appointment with provider in 2-3 weeks. She voiced understanding and agreed to plan of care.

## 2020-07-31 LAB — BETA HCG QUANT (REF LAB): hCG Quant: 2 m[IU]/mL

## 2020-08-12 ENCOUNTER — Ambulatory Visit: Payer: Self-pay | Admitting: Registered Nurse

## 2020-08-14 ENCOUNTER — Ambulatory Visit: Payer: Self-pay | Admitting: Obstetrics & Gynecology

## 2020-08-21 ENCOUNTER — Ambulatory Visit: Payer: Self-pay | Admitting: Obstetrics & Gynecology

## 2021-01-03 IMAGING — US US OB < 14 WEEKS - US OB TV
1 series · 15 of 28 positions shown · non-contrast
Comparison: None.

CLINICAL DATA: Vaginal bleeding with positive beta HCG

EXAM:
OBSTETRIC <14 WK US AND TRANSVAGINAL OB US
TECHNIQUE: Both transabdominal and transvaginal ultrasound examinations were
performed for complete evaluation of the gestation as well as the
maternal uterus, adnexal regions, and pelvic cul-de-sac.
Transvaginal technique was performed to assess early pregnancy.

[Series 1: us ob < 14 weeks - us ob tv · 15 of 59 slices shown]
[im 1/59]
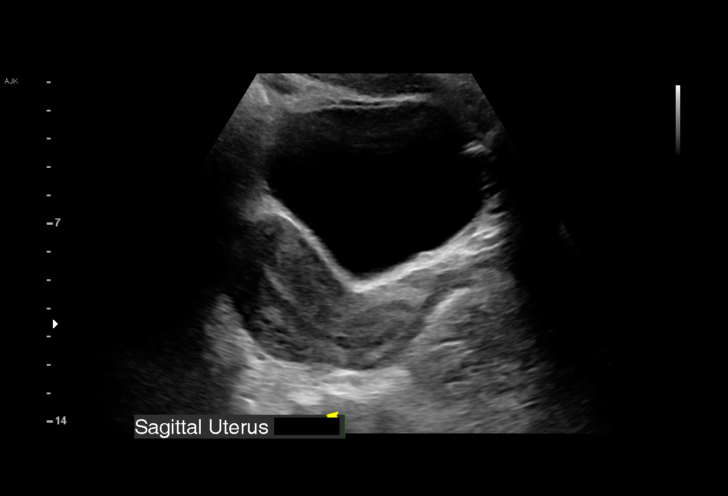
[im 5/59]
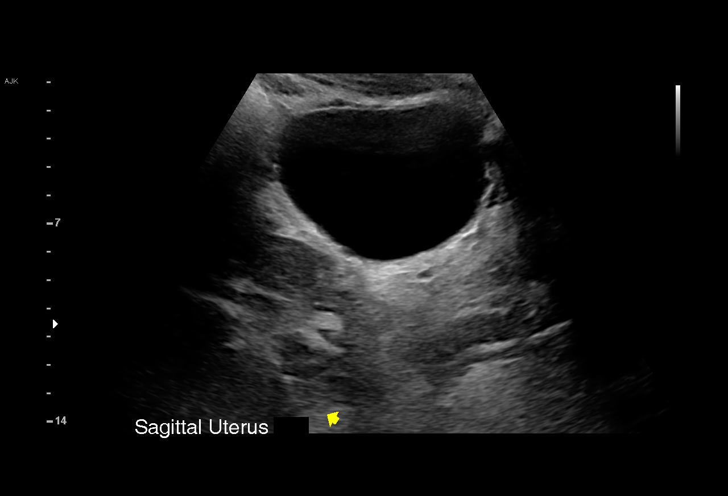
[im 9/59]
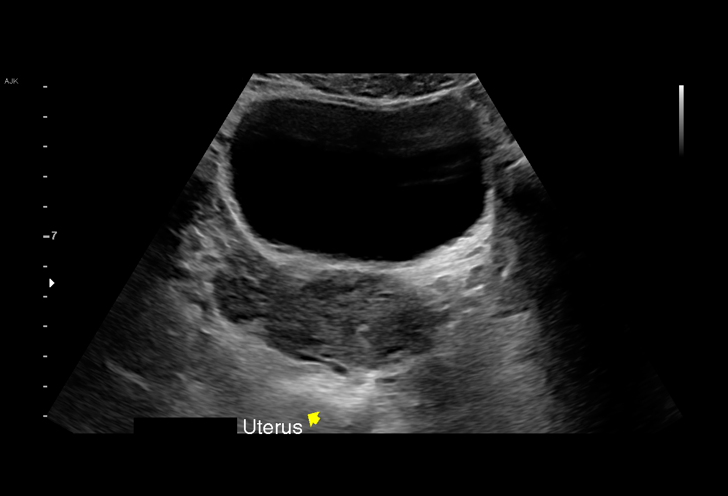
[im 13/59]
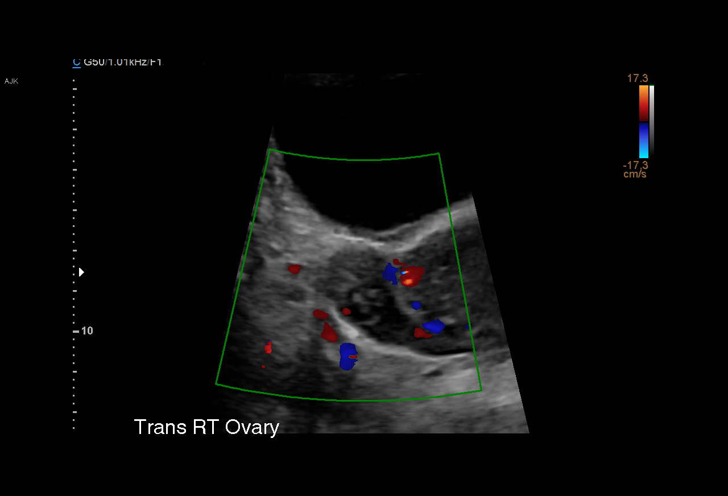
[im 18/59]
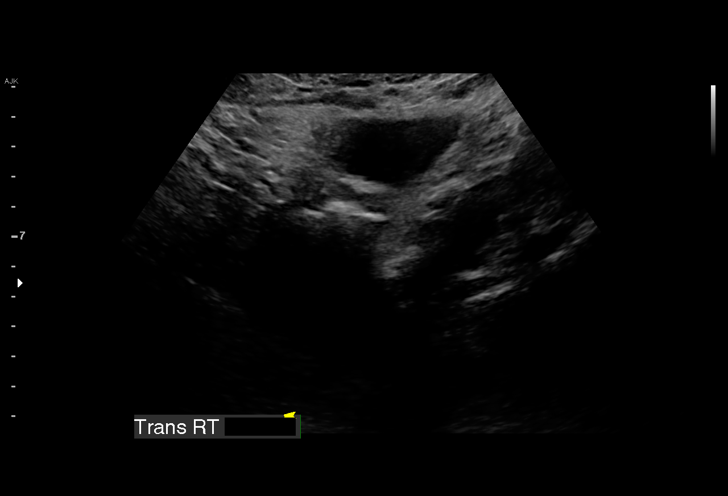
[im 22/59]
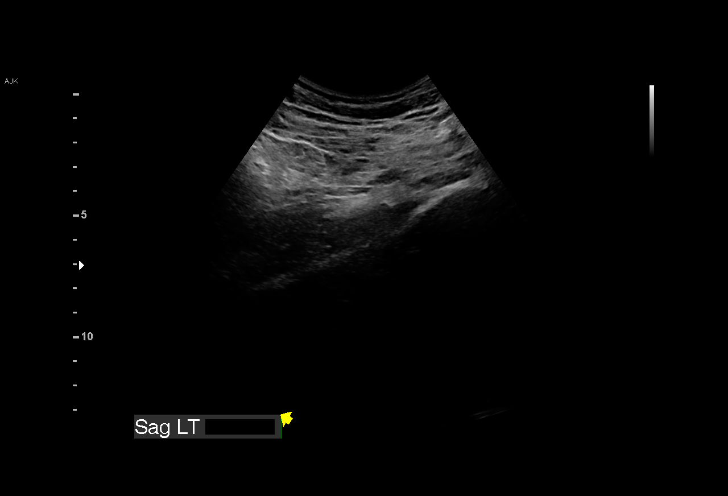
[im 26/59]
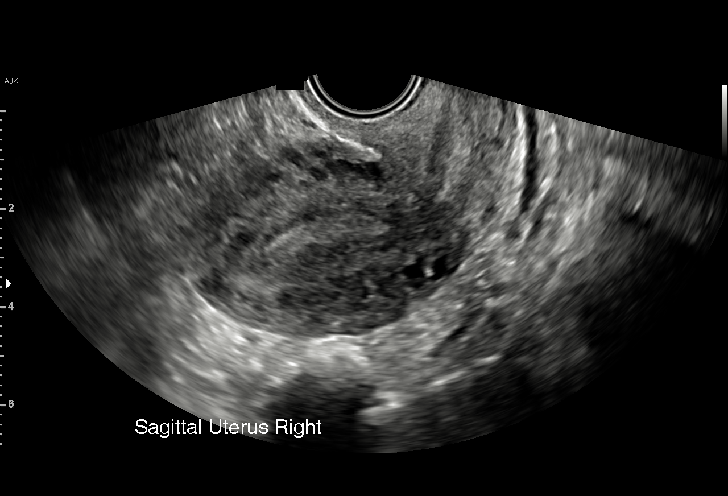
[im 31/59]
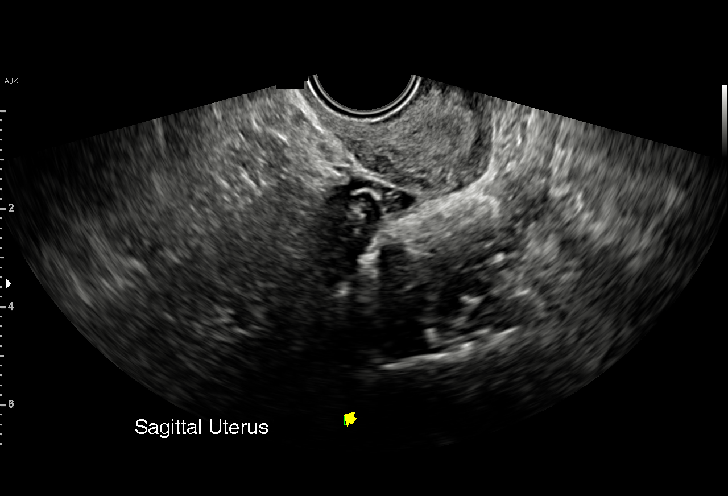
[im 33/59]
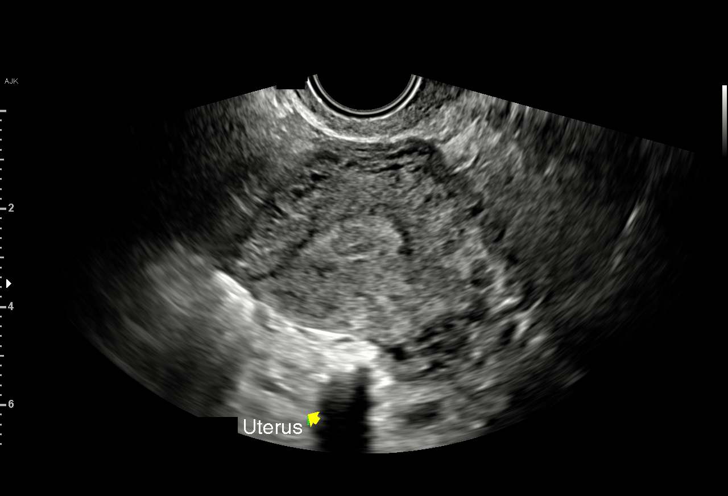
[im 37/59]
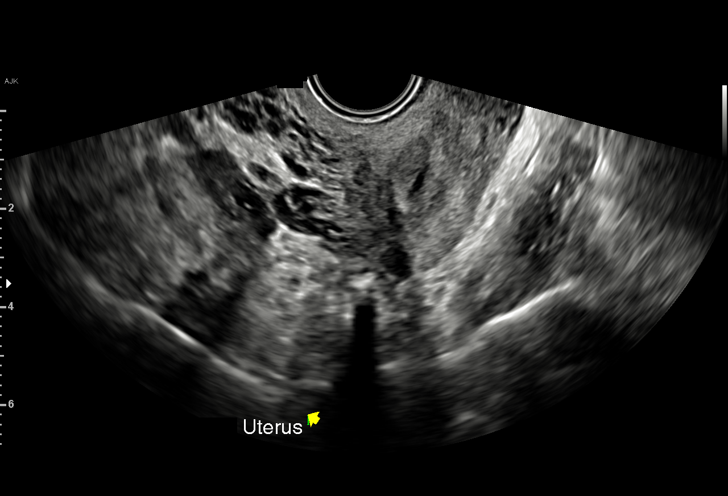
[im 41/59]
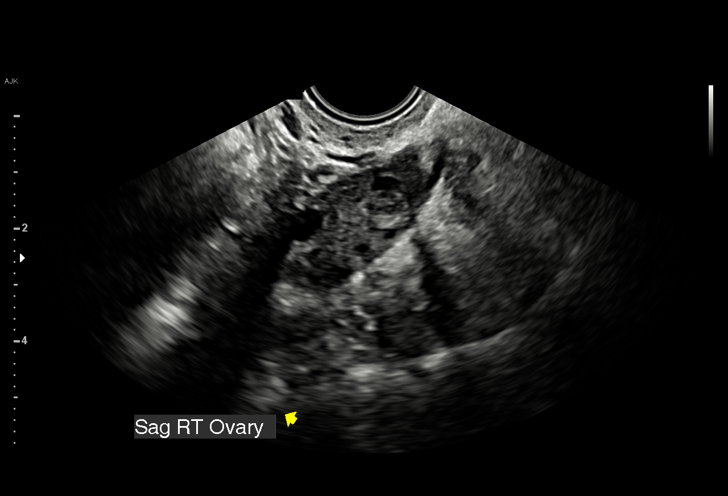
[im 46/59]
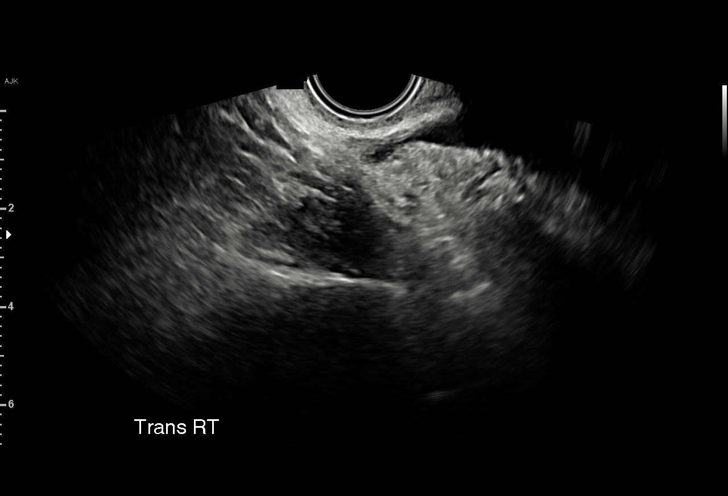
[im 50/59]
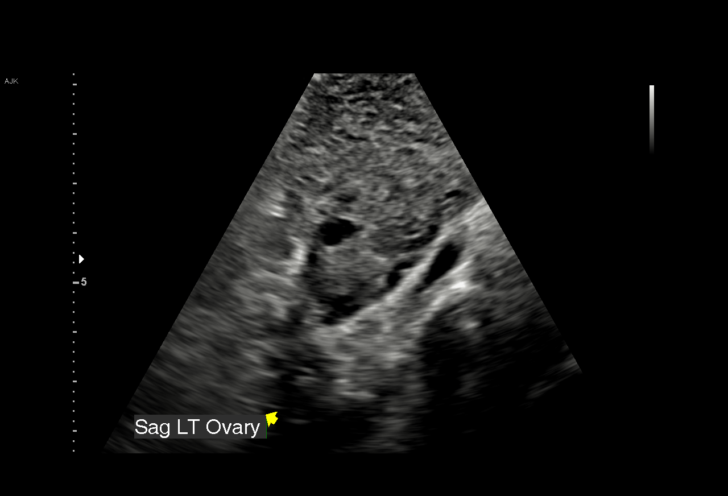
[im 54/59]
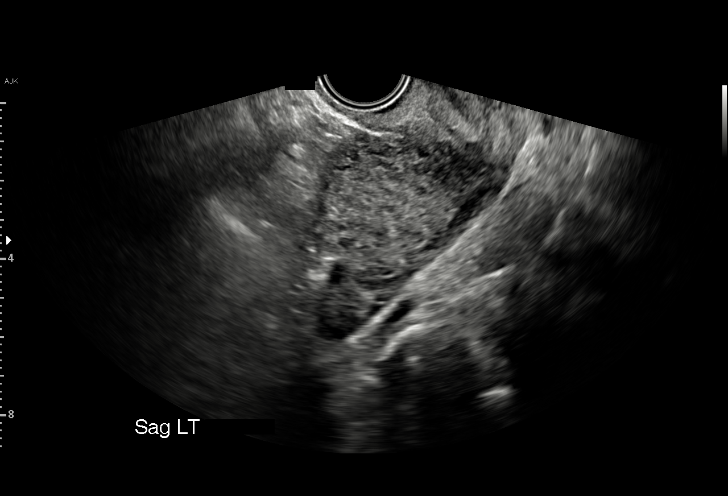
[im 59/59]
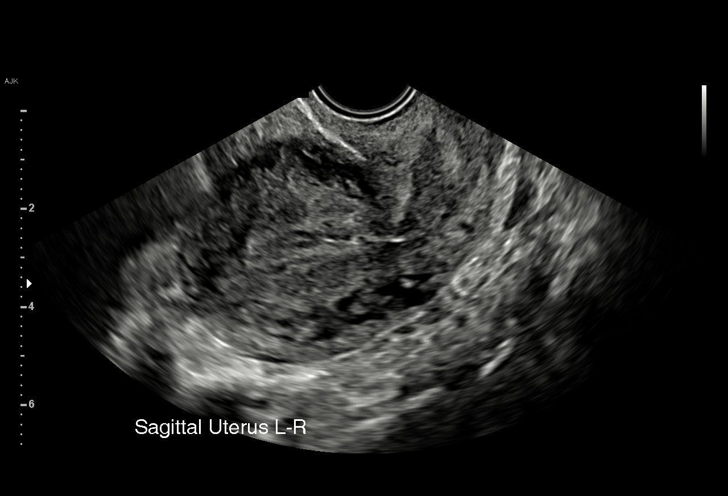

[15 of 28 positions shown; findings below may reference images not displayed]

FINDINGS: Intrauterine gestational sac: Not visualized

Yolk sac:  Not visualized

Embryo:  Not visualized

Cardiac Activity: Not visualized

Subchorionic hemorrhage:  None visualized.

Maternal uterus/adnexae: Cervical os is closed. No intrauterine
mass. No endometrial thickening.

Right ovary measures 2.8 x 1.7 x 2.1 cm. Left ovary measures 2.7 x
2.1 x 2.2 cm. No extrauterine pelvic mass. No free pelvic fluid.
IMPRESSION: No intrauterine gestation evident. Given positive beta HCG value,
differential considerations must include intrauterine gestation too
early to be seen by either transabdominal or transvaginal technique;
recent spontaneous abortion; possible ectopic gestation. This
circumstance warrants close clinical and laboratory assessment.
Timing of repeat ultrasound in large part will depend on beta HCG
values going forward.

No abnormality appreciable on this study.

## 2021-03-06 ENCOUNTER — Other Ambulatory Visit: Payer: Self-pay

## 2021-03-06 DIAGNOSIS — Z9889 Other specified postprocedural states: Secondary | ICD-10-CM

## 2021-04-22 ENCOUNTER — Other Ambulatory Visit: Payer: Self-pay

## 2021-04-22 ENCOUNTER — Ambulatory Visit: Payer: Self-pay | Admitting: *Deleted

## 2021-04-22 ENCOUNTER — Ambulatory Visit
Admission: RE | Admit: 2021-04-22 | Discharge: 2021-04-22 | Disposition: A | Discharge status: Home / Self Care | Payer: No Typology Code available for payment source | Source: Ambulatory Visit | Attending: Obstetrics and Gynecology | Admitting: Obstetrics and Gynecology

## 2021-04-22 VITALS — BP 104/68 | Wt 124.1 lb

## 2021-04-22 DIAGNOSIS — Z9889 Other specified postprocedural states: Secondary | ICD-10-CM

## 2021-04-22 DIAGNOSIS — N6315 Unspecified lump in the right breast, overlapping quadrants: Secondary | ICD-10-CM

## 2021-04-22 DIAGNOSIS — Z1239 Encounter for other screening for malignant neoplasm of breast: Secondary | ICD-10-CM

## 2021-04-22 DIAGNOSIS — N6311 Unspecified lump in the right breast, upper outer quadrant: Secondary | ICD-10-CM

## 2021-04-22 NOTE — Progress Notes (Signed)
[name] is a 25 y.o. female who presents to [facility] clinic today with complaint of a cluster of lumps outer breast since February 2022..    Pap Smear: Pap smear not completed today. Last Pap smear was 09/24/2020 at [facility] and was normal. Patient has a history of a Pap smear 09/01/2018 that was normal with positive HPV. Per patient has no history of an abnormal Pap smear. Last Pap smear result is not available in Epic. Last Pap smear result will be scanned into Epic.   Physical exam: Breasts Right breast larger than left breat that was noted in previous exam 09/01/2018. No skin abnormalities bilateral breasts. No nipple retraction bilateral breasts. No nipple discharge bilateral breasts. No lymphadenopathy. No lumps palpated left breast. Palpated two lumps within the right breast at 9 o'clock 4 cm from the nipple and 11 o'clock 7 cm from the nipple. No complaints of pain or tenderness on exam.      MM Breast Surgical Specimen  Result Date: 10/12/2018 CLINICAL DATA:  Status post seed localized excision of RIGHT breast lesion. EXAM: SPECIMEN RADIOGRAPH OF THE RIGHT BREAST COMPARISON:  Previous exam(s). FINDINGS: Status post excision of the right breast. The radioactive seed and biopsy marker clip are present incompletely intact. IMPRESSION: Specimen radiograph of the right breast. Electronically Signed   By: Nolon Nations M.D.   On: 10/12/2018 16:52    Pelvic/Bimanual Pap is not indicated today per BCCCP guidelines.   Smoking History: Patient is a former smoker that quit in October 2021.   Patient Navigation: Patient education provided. Access to services provided for patient through BCCCP program.   Breast and Cervical Cancer Risk Assessment: Patient does not have family history of breast cancer, known genetic mutations, or radiation treatment to the chest before age 9. Patient does not have history of cervical dysplasia, immunocompromised, or DES  exposure in-utero. Breast cancer risk assessment completed. No breast cancer risk calculated due to patient is less than 21 years old.  Risk Assessment     Risk Scores       04/22/2021 09/01/2018   Last edited by: Demetrius Revel, LPN Rolena Infante H, LPN   5-year risk:     Lifetime risk:              A: BCCCP exam without pap smear Complaint of right breast lump.  P: Referred patient to the [facility] for a right breast ultrasound. Appointment scheduled Tuesday, April 22, 2021 at 1320.  Jacqueline Parish, RN 04/22/2021 11:39 AM

## 2021-04-22 NOTE — Patient Instructions (Signed)
Explained breast self awareness with Jacqueline [name]. Patient did not need a Pap smear today due to last Pap smear was 09/24/2020. Let her know BCCCP will cover Pap smears every 3 years unless has a history of abnormal Pap smears. Referred patient to the [facility] for a right breast ultrasound. Appointment scheduled Tuesday, April 22, 2021 at 1320. Patient aware of appointment and will be there. Jacqueline [name] verbalized understanding.  Kaiyah Eber, [name], RN 11:39 AM

## 2021-10-30 IMAGING — US US BREAST*R* LIMITED INC AXILLA
1 series · 7 of 7 positions shown · non-contrast
Comparison: Previous exam(s).

CLINICAL DATA: Patient presents for palpable abnormality and
tenderness within the outer right breast.

EXAM:
ULTRASOUND OF THE RIGHT BREAST

[Series 1: us breast*right* limited inc axilla · 0.07mm/px · 7 of 7 slices shown]
[im 1/7]
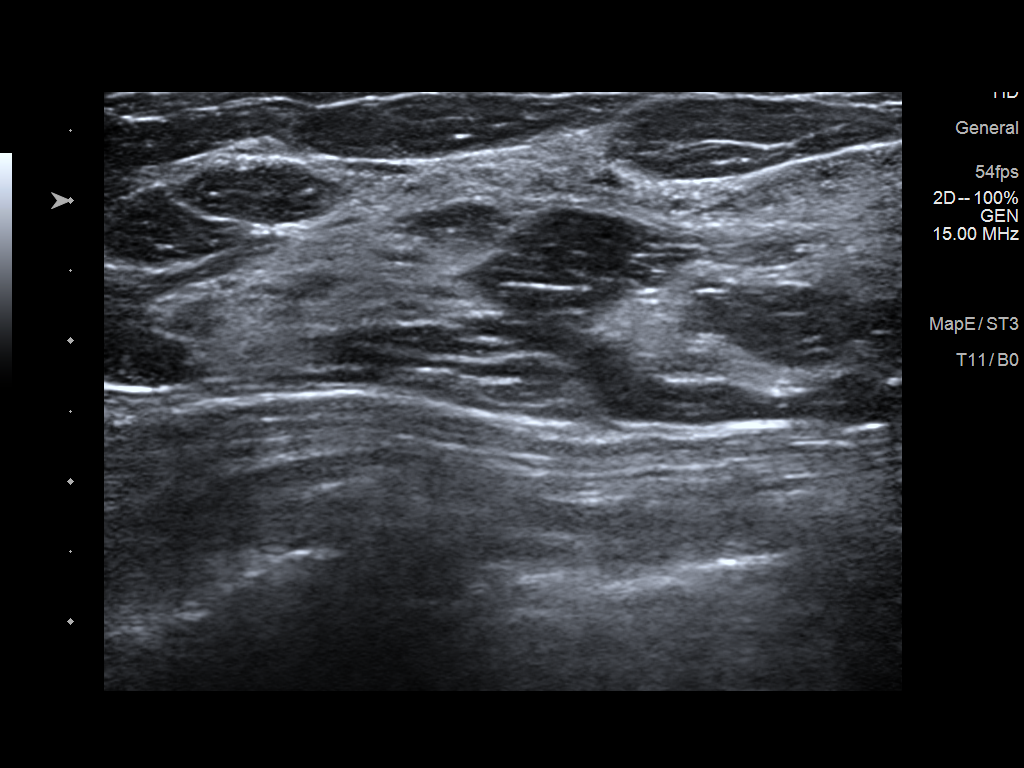
[im 2/7]
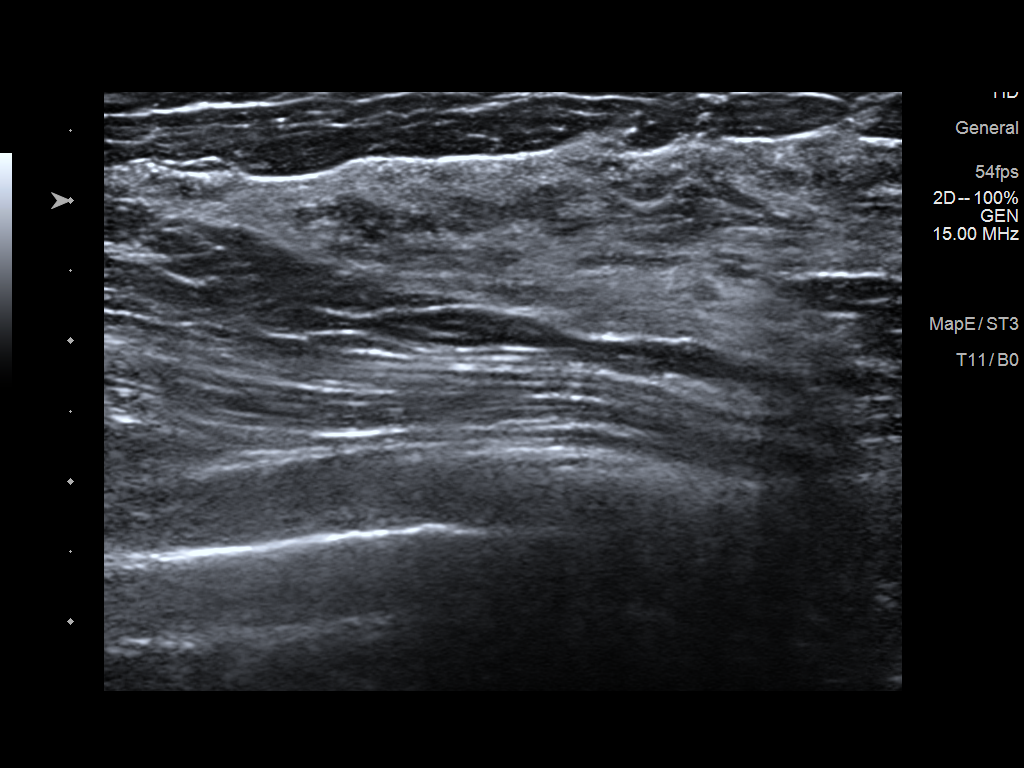
[im 3/7]
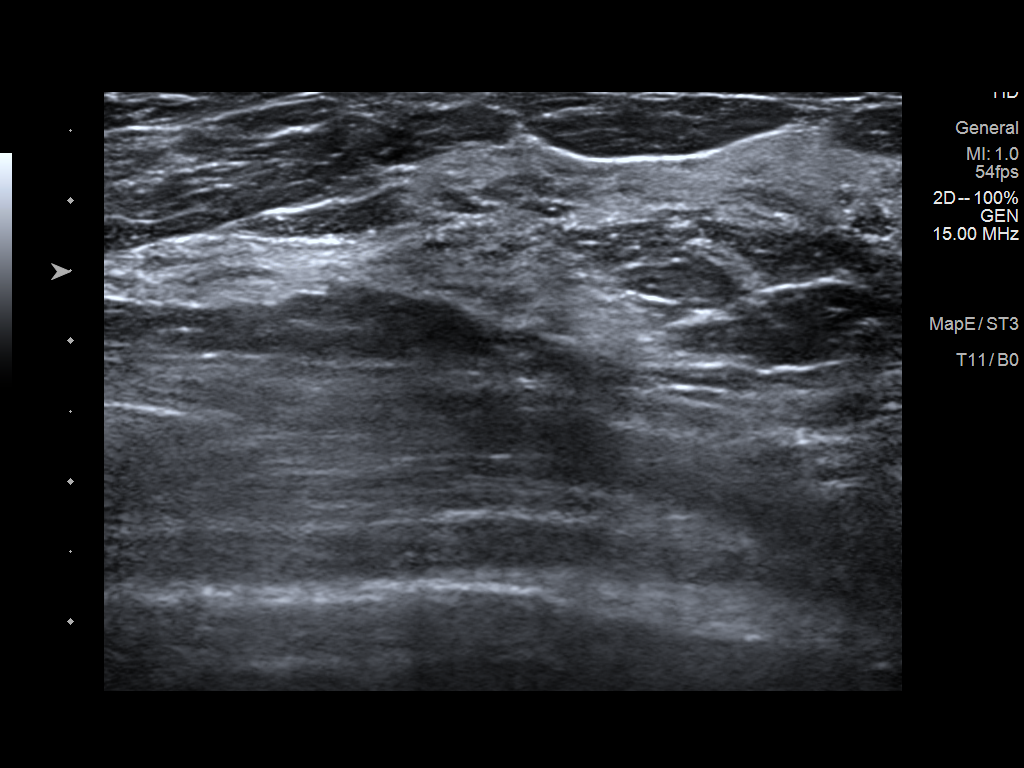
[im 4/7]
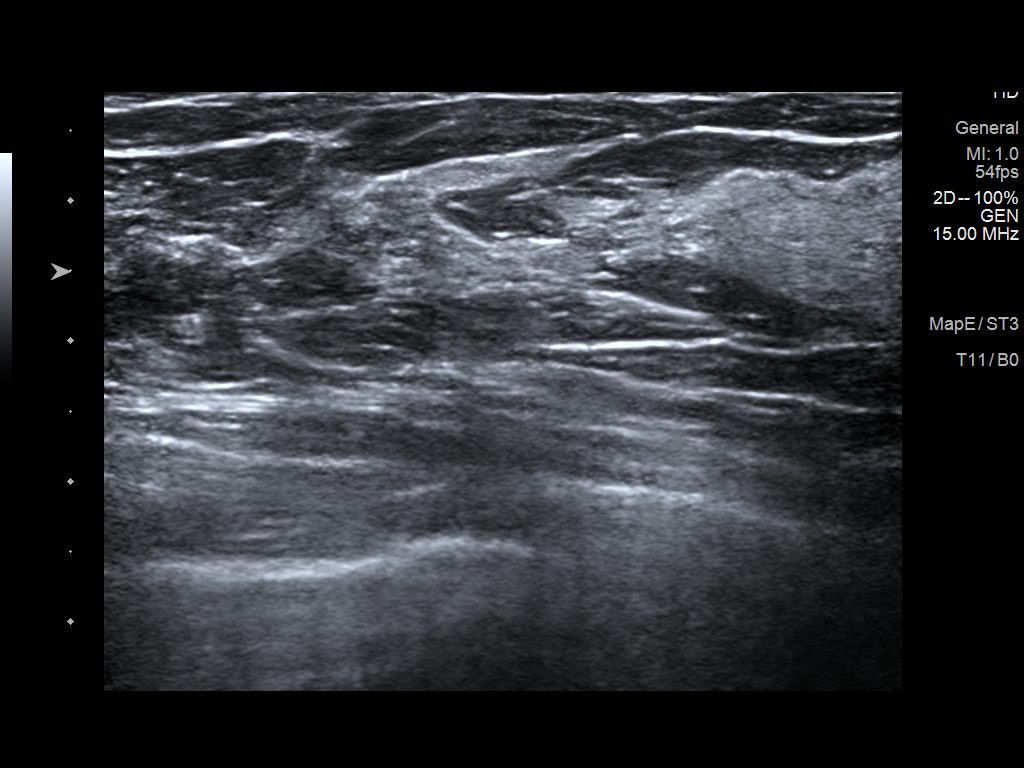
[im 5/7]
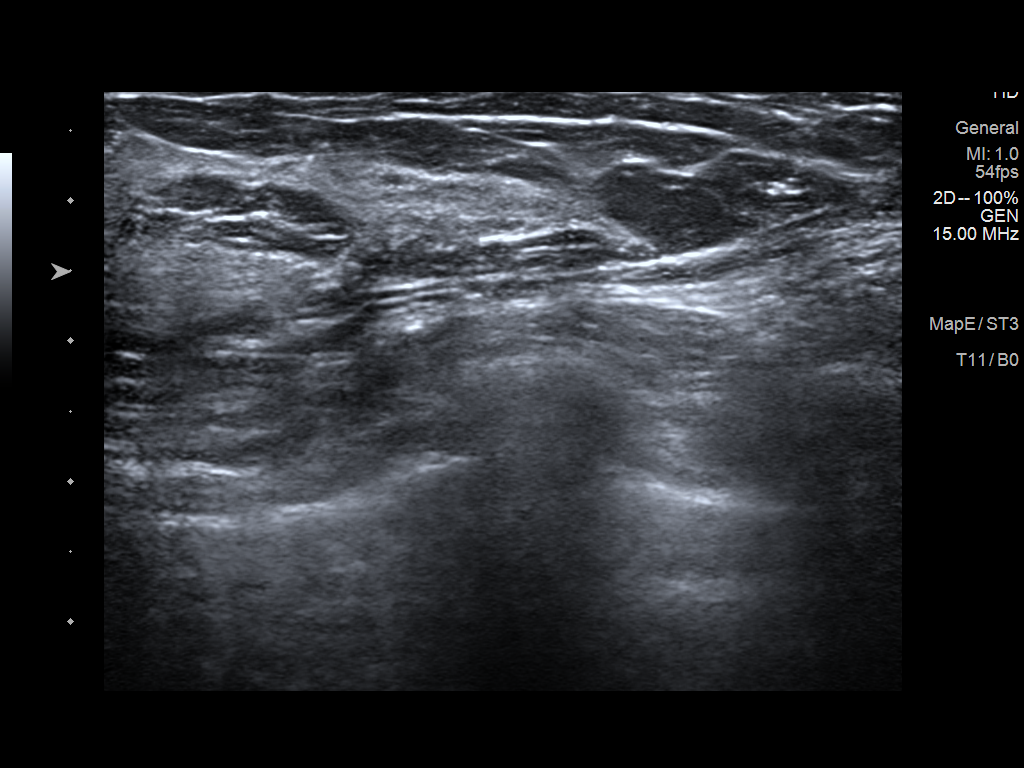
[im 6/7]
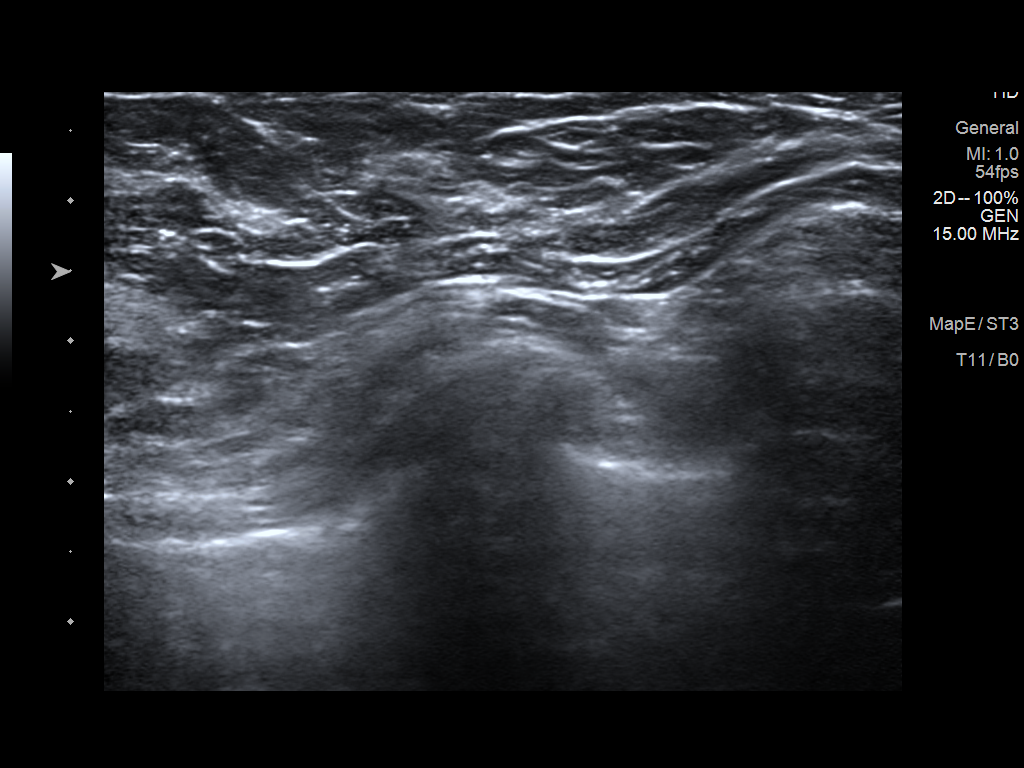
[im 7/7]
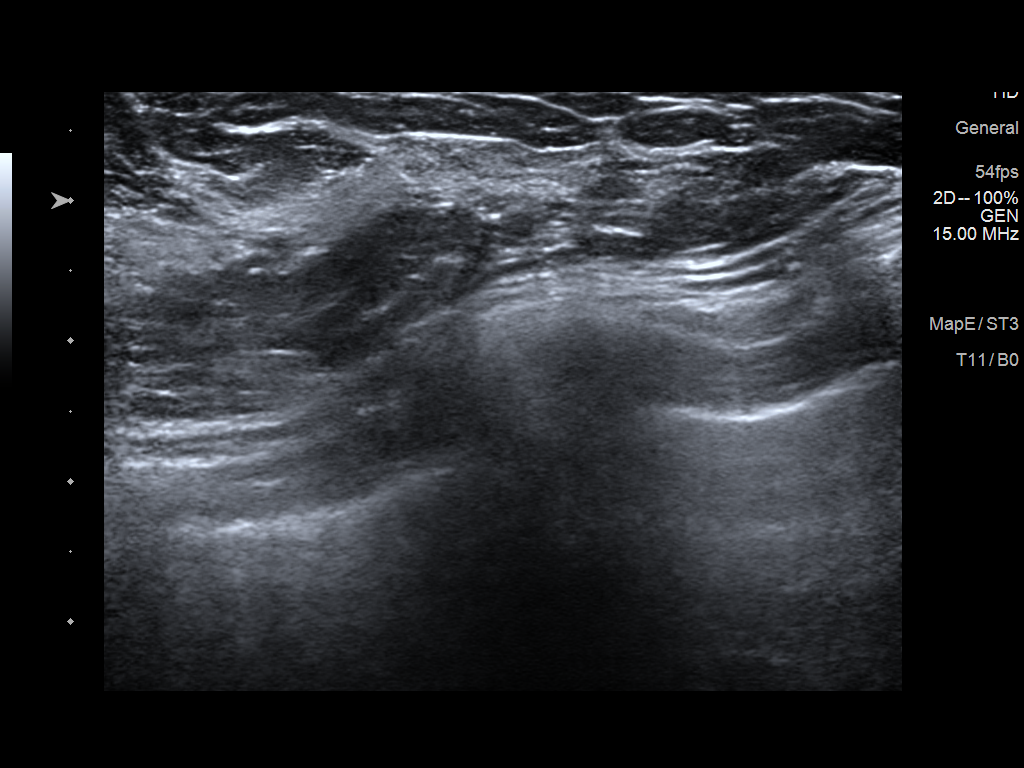

[7 of 7 positions shown; findings below may reference images not displayed]

FINDINGS: On physical exam, dense tissue is palpated within the outer right
breast.

Targeted ultrasound is performed, showing dense tissue without
suspicious mass within the outer right breast.
IMPRESSION: Normal tissue without suspicious mass within the outer right breast.

RECOMMENDATION:
Continued clinical evaluation for palpable abnormality right breast.

Screening mammogram at age 40 unless there are persistent or
intervening clinical concerns. (Code:[id number]-9QT)

I have discussed the findings and recommendations with the patient.
If applicable, a reminder letter will be sent to the patient
regarding the next appointment.

BI-RADS CATEGORY  1: Negative.
